# Patient Record
Sex: Male | Born: 1945 | Race: Black or African American | Hispanic: No | Marital: Single | Smoking: Current every day smoker
Health system: Southern US, Community
[De-identification: ages and names within clinical notes are randomized; demographics above are authoritative.]

## PROBLEM LIST (undated history)

## (undated) DIAGNOSIS — K5900 Constipation, unspecified: Secondary | ICD-10-CM

## (undated) DIAGNOSIS — I10 Essential (primary) hypertension: Secondary | ICD-10-CM

## (undated) DIAGNOSIS — F17200 Nicotine dependence, unspecified, uncomplicated: Secondary | ICD-10-CM

## (undated) DIAGNOSIS — F329 Major depressive disorder, single episode, unspecified: Secondary | ICD-10-CM

## (undated) DIAGNOSIS — F32A Depression, unspecified: Secondary | ICD-10-CM

## (undated) DIAGNOSIS — M109 Gout, unspecified: Secondary | ICD-10-CM

## (undated) DIAGNOSIS — C801 Malignant (primary) neoplasm, unspecified: Secondary | ICD-10-CM

## (undated) HISTORY — DX: Nicotine dependence, unspecified, uncomplicated: F17.200

## (undated) HISTORY — DX: Constipation, unspecified: K59.00

## (undated) HISTORY — DX: Essential (primary) hypertension: I10

## (undated) HISTORY — PX: FINGER SURGERY: SHX640

## (undated) HISTORY — DX: Depression, unspecified: F32.A

## (undated) HISTORY — DX: Gout, unspecified: M10.9

---
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
## 1999-07-28 ENCOUNTER — Ambulatory Visit (HOSPITAL_COMMUNITY): Admission: RE | Admit: 1999-07-28 | Discharge: 1999-07-28 | Payer: Self-pay | Admitting: Neurosurgery

## 1999-07-28 ENCOUNTER — Encounter: Payer: Self-pay | Admitting: Neurosurgery

## 1999-08-26 ENCOUNTER — Ambulatory Visit (HOSPITAL_COMMUNITY): Admission: RE | Admit: 1999-08-26 | Discharge: 1999-08-26 | Payer: Self-pay | Admitting: Neurosurgery

## 1999-08-26 ENCOUNTER — Encounter: Payer: Self-pay | Admitting: Neurosurgery

## 1999-09-09 ENCOUNTER — Encounter: Payer: Self-pay | Admitting: Neurosurgery

## 1999-09-09 ENCOUNTER — Ambulatory Visit (HOSPITAL_COMMUNITY): Admission: RE | Admit: 1999-09-09 | Discharge: 1999-09-09 | Payer: Self-pay | Admitting: Neurosurgery

## 1999-09-23 ENCOUNTER — Encounter: Payer: Self-pay | Admitting: Neurosurgery

## 1999-09-23 ENCOUNTER — Ambulatory Visit (HOSPITAL_COMMUNITY): Admission: RE | Admit: 1999-09-23 | Discharge: 1999-09-23 | Payer: Self-pay | Admitting: Neurosurgery

## 2002-03-21 ENCOUNTER — Ambulatory Visit (HOSPITAL_COMMUNITY): Admission: RE | Admit: 2002-03-21 | Discharge: 2002-03-21 | Payer: Self-pay | Admitting: Cardiology

## 2004-07-12 ENCOUNTER — Ambulatory Visit (HOSPITAL_COMMUNITY): Admission: RE | Admit: 2004-07-12 | Discharge: 2004-07-12 | Payer: Self-pay | Admitting: Gastroenterology

## 2005-10-20 ENCOUNTER — Ambulatory Visit (HOSPITAL_BASED_OUTPATIENT_CLINIC_OR_DEPARTMENT_OTHER): Admission: RE | Admit: 2005-10-20 | Discharge: 2005-10-20 | Payer: Self-pay | Admitting: Orthopedic Surgery

## 2006-12-15 ENCOUNTER — Encounter: Admission: RE | Admit: 2006-12-15 | Discharge: 2006-12-15 | Payer: Self-pay | Admitting: Cardiology

## 2008-10-21 ENCOUNTER — Emergency Department (HOSPITAL_COMMUNITY): Admission: EM | Admit: 2008-10-21 | Discharge: 2008-10-21 | Payer: Self-pay | Admitting: Emergency Medicine

## 2009-05-16 ENCOUNTER — Encounter (INDEPENDENT_AMBULATORY_CARE_PROVIDER_SITE_OTHER): Payer: Self-pay | Admitting: Cardiology

## 2009-05-16 ENCOUNTER — Ambulatory Visit (HOSPITAL_COMMUNITY): Admission: RE | Admit: 2009-05-16 | Discharge: 2009-05-16 | Payer: Self-pay | Admitting: Cardiology

## 2009-05-16 ENCOUNTER — Ambulatory Visit: Payer: Self-pay | Admitting: Surgery

## 2009-06-29 ENCOUNTER — Encounter (INDEPENDENT_AMBULATORY_CARE_PROVIDER_SITE_OTHER): Payer: Self-pay | Admitting: *Deleted

## 2009-07-02 ENCOUNTER — Ambulatory Visit: Payer: Self-pay | Admitting: Internal Medicine

## 2009-07-12 ENCOUNTER — Ambulatory Visit: Payer: Self-pay | Admitting: Internal Medicine

## 2009-07-12 HISTORY — PX: COLONOSCOPY: SHX174

## 2009-07-25 ENCOUNTER — Encounter: Payer: Self-pay | Admitting: Internal Medicine

## 2010-08-27 NOTE — Miscellaneous (Signed)
Summary: [facility] Previsit/prep  Clinical Lists Changes  Medications: Added new medication of MOVIPREP 100 GM  SOLR (PEG-KCL-NACL-NASULF-NA ASC-C) As per prep instructions. - Signed Rx of MOVIPREP 100 GM  SOLR (PEG-KCL-NACL-NASULF-NA ASC-C) As per prep instructions.;  #1 x 0;  Signed;  Entered by: Wyona Almas RN;  Authorized by: Hart Carwin MD;  Method used: Electronically to [facility] [address] [place], [place], [place]  [address], Ph: [phone], Fax: [phone] Observations: Added new observation of NKA: T (07/02/2009 9:36)    Prescriptions: MOVIPREP 100 GM  SOLR (PEG-KCL-NACL-NASULF-NA ASC-C) As per prep instructions.  #1 x 0   Entered by:   Wyona Almas RN   Authorized by:   Hart Carwin MD   Signed by:   Wyona Almas RN on 07/02/2009   Method used:   Electronically to        [facility] (retail)       [address]       [place], [place]  [address]       Ph: [phone]       Fax: [phone]   RxID:   [id number]

## 2010-08-27 NOTE — Letter (Signed)
Summary: [facility] Instructions  [facility]  [address] [place], [place] [address]   Phone: [phone]  Fax: [phone]       CLARE CASTO    [birth year]    MRN: [id number]        Procedure Day Dorna Bloom: Magdalene Molly 07/12/09     Arrival Time: 1:30pm     Procedure Time: 2:30pm     Location of Procedure:                    _ x_  [facility] (4th Floor)                        PREPARATION FOR COLONOSCOPY WITH MOVIPREP   Starting 5 days prior to your procedure 12/11 do not eat nuts, seeds, popcorn, corn, beans, peas,  salads, or any raw vegetables.  Do not take any fiber supplements (e.g. Metamucil, Citrucel, and Benefiber).  THE DAY BEFORE YOUR PROCEDURE   DATE:  12/15   DAY:  Wed 1.  Drink clear liquids the entire day-NO SOLID FOOD  2.  Do not drink anything colored red or purple.  Avoid juices with pulp.  No orange juice.  3.  Drink at least 64 oz. (8 glasses) of fluid/clear liquids during the day to prevent dehydration and help the prep work efficiently.  CLEAR LIQUIDS INCLUDE: Water Jello Ice Popsicles Tea (sugar ok, no milk/cream) Powdered fruit flavored drinks Coffee (sugar ok, no milk/cream) Gatorade Juice: apple, white grape, white cranberry  Lemonade Clear bullion, consomm, broth Carbonated beverages (any kind) Strained chicken noodle soup Hard Candy                             4.  In the morning, mix first dose of MoviPrep solution:    Empty 1 Pouch A and 1 Pouch B into the disposable container    Add lukewarm drinking water to the top line of the container. Mix to dissolve    Refrigerate (mixed solution should be used within 24 hrs)  5.  Begin drinking the prep at 5:00 p.m. The MoviPrep container is divided by 4 marks.   Every 15 minutes drink the solution down to the next mark (approximately 8 oz) until the full liter is complete.   6.  Follow completed prep with 16 oz of clear liquid of your choice (Nothing red or purple).   Continue to drink clear liquids until bedtime.  7.  Before going to bed, mix second dose of MoviPrep solution:    Empty 1 Pouch A and 1 Pouch B into the disposable container    Add lukewarm drinking water to the top line of the container. Mix to dissolve    Refrigerate  THE DAY OF YOUR PROCEDURE      DATE:  12/16 DAY: Thurs  Beginning at 9:30am. (5 hours before procedure):         1. Every 15 minutes, drink the solution down to the next mark (approx 8 oz) until the full liter is complete.  2. Follow completed prep with 16 oz. of clear liquid of your choice.    3. You may drink clear liquids until  12:30pm  (2 HOURS BEFORE PROCEDURE).   MEDICATION INSTRUCTIONS  Unless otherwise instructed, you should take regular prescription medications with a small sip of water   as early as possible the morning of your procedure.  OTHER INSTRUCTIONS  You will need a responsible adult at least 66 years of age to accompany you and drive you home.   This person must remain in the waiting room during your procedure.  Wear loose fitting clothing that is easily removed.  Leave jewelry and other valuables at home.  However, you may wish to bring a book to read or  an iPod/MP3 player to listen to music as you wait for your procedure to start.  Remove all body piercing jewelry and leave at home.  Total time from sign-in until discharge is approximately 2-3 hours.  You should go home directly after your procedure and rest.  You can resume normal activities the  day after your procedure.  The day of your procedure you should not:   Drive   Make legal decisions   Operate machinery   Drink alcohol   Return to work  You will receive specific instructions about eating, activities and medications before you leave.    The above instructions have been reviewed and explained to me by   Wyona Almas RN  July 02, 2009 10:09 AM     I fully understand and can verbalize  these instructions _____________________________ Date _________

## 2010-12-13 NOTE — Op Note (Signed)
NAMECONNOR, George Olsen                ACCOUNT NO.:  [id number]   MEDICAL RECORD NO.:  [id number]          PATIENT TYPE:  AMB   LOCATION:  ENDO                         FACILITY:  [facility]   PHYSICIAN:  Jordan Hawks. Elnoria Howard, MD    DATE OF BIRTH:  December 27, 1945   DATE OF PROCEDURE:  07/12/2004  DATE OF DISCHARGE:                                 OPERATIVE REPORT   PROCEDURE:  Colonoscopy.   INDICATIONS:  Screening.   REFERRING PHYSICIAN:  Dr. Perlie Gold.   CONSENT:  Informed consent was obtained from the patient describing the  risks of bleeding, infection, perforation, medication reaction, a 10% miss  rate for a small colon cancer or polyp, and the risk of death, all of which  are not exclusive of any other complications that may occur.   PHYSICAL EXAMINATION:  CARDIAC:  Regular rate and rhythm.  CHEST:  Lungs are clear to auscultation bilaterally.  ABDOMEN:  Soft, nontender, nondistended.   MEDICATIONS:  Versed 5 mg IV, Demerol 75 mg IV.   PROCEDURE:  After adequate sedation was achieved, a rectal examination was  performed which was negative for any palpable abnormality.  The colonoscope  was then inserted in the anus and advanced under direct visualization  without difficulty to the terminal ileum.  Photo documentation of the  terminal ileum and the cecum was obtained.  The patient was noted to have a  very good prep.  Upon slow withdrawal of the colonoscope, there was no  evidence of masses, polyps, inflammation, ulceration, erosions, or vascular  abnormalities in the cecum, ascending, transverse, descending, or sigmoid  colon.  The patient is noted to have scattered very small diverticula  throughout the entire colon.  There is a higher concentration within the  descending and sigmoid colon.  There is one area that appeared to be an  inverted diverticulum.  Forceps palpation of this area revealed that the  area was smooth, however was not able to be completely reduced.  There is no  obvious  sign of abnormal growth in this area.  Retroflexion within the  rectum revealed mild external hemorrhoids.  The colonoscope was then  straightened and withdrawn from the patient, and the procedure was  terminated.  The patient tolerated the procedure well.  No complications  were encountered.   PLAN:  1.  Repeat colonoscopy in five years with a closer inspection of the sigmoid      colon for the possible inverted diverticulosis versus a true polyp.  2.  The patient is to maintain a high-fiber diet.       PDH/MEDQ  D:  07/12/2004  T:  07/12/2004  Job:  [id number]   cc:   Dr. Perlie Gold

## 2010-12-13 NOTE — Cardiovascular Report (Signed)
NAMEHARVEER, George Olsen                            ACCOUNT NO.:  [id number]   MEDICAL RECORD NO.:  [id number]                   PATIENT TYPE:  OIB   LOCATION:  2899                                 FACILITY:  [facility]   PHYSICIAN:  Armanda Magic, M.D.                  DATE OF BIRTH:  Mar 02, 1946   DATE OF PROCEDURE:  04/21/2002  DATE OF DISCHARGE:  03/21/2002                              CARDIAC CATHETERIZATION   REFERRING PHYSICIAN:  Chales Salmon. Abigail Miyamoto, M.D.   INDICATIONS FOR PROCEDURE:  Strong family history of coronary artery disease  and abnormal exercise treadmill test with decreased perfusion in the  inferior wall.   DESCRIPTION OF PROCEDURE:  The patient was brought to the cardiac  catheterization lab in the fasting nonsedated state.  Informed consented was  obtained.  The patient was connected to continuous heart rate and pulse  oximetry monitoring and intermittent blood pressure monitoring. The right  groin was prepped and draped in the usual sterile fashion.  1% Xylocaine was  used for local anesthesia.  Using the modified Seldinger technique, a 6  French sheath was placed in the right femoral artery.  Under fluoroscopic  guidance, a 6 [place] JL4 catheter was placed in the left coronary artery.  Multiple cine films were taken in 30 degree RAO and 40 degree LAO views.  This catheter was then exchanged out over a guide wire for a 6 [place] JR4  catheter which was placed under fluoroscopic guidance into the right  coronary artery.  Multiple cine films were taken in 30 degree RAO and 40  degree LAO views. This catheter was then exchanged out over a guide wire for  a 6 French angled pigtail catheter which was placed under fluoroscopic  guidance in the left ventricular cavity.  Left ventriculography was  performed in 30 degree RAO view using a total of 30 cc of contrast at 13 cc  per second.  The catheter was then pulled back across the aortic valve with  no significant gradient.  At the  end of the procedure, all catheters and  sheaths were removed.  Manual compression was performed until adequate  hemostasis was obtained.  The patient was transferred back to the room in  stable condition.   RESULTS:  1. The left main coronary artery is widely patent and bifurcates into a left     anterior descending and left circumflex artery.  2. The left circumflex artery gives rise to three obtuse marginal branches,     all of which are widely patent.  The distal circumflex traverses the AV     groove and is patent.  3. The LAD gives rise to one very large branching diagonal branch which has     a 30% proximal narrowing and gives rise to three daughter branches all of     which are widely patent.  The LAD is widely patent  throughout its course     to the apex and gives off four additional diagonal branches all of which     are widely patent.  4. The right coronary artery is widely patent throughout its course     bifurcating distally to the posterior descending artery and     posterolateral artery.  5. Left ventriculography performed in 30 degree RAO view using a total of 30     cc of contrast at 13 cc per second showed normal left ventricular     systolic function.  EF 55% or greater.  LV pressure 135/10 mmHg, aortic     pressure 135/67 mmHg.   ASSESSMENT:  1. Noncardiac chest pain.  2.     Normal left ventricular function.  3. Nonobstructive coronary artery disease.   PLAN:  Discharge to home after IV fluid and bed rest.  Follow up with Dr.  Mayford Knife in two weeks.  Aspirin q.d.                                               Armanda Magic, M.D.    TT/MEDQ  D:  04/21/2002  T:  04/25/2002  Job:  [id number]   cc:   Chales Salmon. Abigail Miyamoto, M.D.

## 2010-12-13 NOTE — Op Note (Signed)
NAMEARLEN, DUPUIS                ACCOUNT NO.:  [id number]   MEDICAL RECORD NO.:  [id number]          PATIENT TYPE:  AMB   LOCATION:  DSC                          FACILITY:  [facility]   PHYSICIAN:  Feliberto Gottron. Turner Daniels, M.D.   DATE OF BIRTH:  March 03, 1946   DATE OF PROCEDURE:  10/20/2005  DATE OF DISCHARGE:                                 OPERATIVE REPORT   PREOPERATIVE DIAGNOSIS:  Left small finger necrosis after crushing injury at  work.   POSTOPERATIVE DIAGNOSIS:  Left small finger necrosis after crushing injury  at work.   PROCEDURE:  Left small finger distal interphalangeal joint disarticulation  and amputation.   SURGEON:  Feliberto Gottron. Turner Daniels, M.D.   FIRST ASSISTANT:  Erskine Squibb B. Su Hilt, P.A.-C.   ANESTHETIC:  General LMA.   ESTIMATED BLOOD LOSS:  Minimal.   FLUID REPLACEMENT:  500 mL crystalloid.   DRAINS PLACED:  None.   TOURNIQUET TIME:  10 minutes.   INDICATIONS FOR PROCEDURE:  65 year old man injured at work who sustained a  crushing open fracture of the left small finger distal phalanx.  He was  cleansed, loosely close with a suture, and over the next couple of weeks  most of the tip of the small finger necrosed, turned black and shriveled up.  Because of this, he is taken for distal phalanx amputation and  disarticulation at the DIP joint since the finger is already dead from this  level beyond.  The risks and benefits of surgery are understood by the  patient.  He is prepared for surgical intervention.   DESCRIPTION OF PROCEDURE:  The patient identified by armband, taken the  operating room at [facility] where the appropriate site  monitors were attached and general LMA anesthesia induced with the patient  in supine position.  The left hand was then prepped and draped in a sterile  fashion from the fingertips to the wrist and we made a small finger  tourniquet out of a small finger glove that was rolled and held with a  hemostat. We then undermined the areas of  necrotic black skin and simply  pulled the tip of the finger off removing half of the bone and pretty much  the whole distal aspect of the fingertip. We then undermined around the  remainder of the distal phalanx back to the DIP joint and removed the distal  phalanx and freshened up the skin edges in a fishmouth fashion for closure.  The tourniquet was let down, small bleeders were identified and cauterized.  The wound was  thoroughly cleansed with normal saline solution and loosely closed with 3-0  nylon suture in a horizontal fishmouth fashion.  A dressing of Xeroform, 1  inch baby Kerlix and one inch Coban was then applied.  The patient was  awakened and taken to the recovery room without difficulty.      Feliberto Gottron. Turner Daniels, M.D.  Electronically Signed     FJR/MEDQ  D:  10/20/2005  T:  10/21/2005  Job:  [id number]

## 2015-01-22 DIAGNOSIS — N529 Male erectile dysfunction, unspecified: Secondary | ICD-10-CM | POA: Insufficient documentation

## 2015-03-26 ENCOUNTER — Other Ambulatory Visit: Payer: Self-pay | Admitting: Dermatology

## 2015-09-11 DIAGNOSIS — L0291 Cutaneous abscess, unspecified: Secondary | ICD-10-CM | POA: Insufficient documentation

## 2015-10-31 ENCOUNTER — Encounter: Payer: Self-pay | Admitting: Internal Medicine

## 2016-03-05 ENCOUNTER — Ambulatory Visit (INDEPENDENT_AMBULATORY_CARE_PROVIDER_SITE_OTHER): Payer: Commercial Managed Care - PPO | Admitting: Podiatry

## 2016-03-05 ENCOUNTER — Encounter: Payer: Self-pay | Admitting: Podiatry

## 2016-03-05 VITALS — BP 174/85 | HR 74 | Ht 73.0 in | Wt 170.0 lb

## 2016-03-05 DIAGNOSIS — M79606 Pain in leg, unspecified: Secondary | ICD-10-CM

## 2016-03-05 DIAGNOSIS — B351 Tinea unguium: Secondary | ICD-10-CM | POA: Diagnosis not present

## 2016-03-05 NOTE — Progress Notes (Signed)
SUBJECTIVE: 70 y.o. year old male presents with painful feet. Has a painful callus under left foot and nails are too thick and hard to manage. He is still working in a [employer], [employer]/film/video.   REVIEW OF SYSTEMS: A comprehensive review of systems was negative.  OBJECTIVE: DERMATOLOGIC EXAMINATION: Nails: Thick dystrophic nails x 10.  Calluses: broad circular callus under the 1st MPJ left foot.   VASCULAR EXAMINATION OF LOWER LIMBS: Pedal pulses: All pedal pulses are palpable with normal pulsation.  Temperature gradient from tibial crest to dorsum of foot is within normal bilateral.  NEUROLOGIC EXAMINATION OF THE LOWER LIMBS: All epicritic and tactile sensations grossly intact.   MUSCULOSKELETAL EXAMINATION: Positive for keratoma under 1st MPJ left foot.   ASSESSMENT: Onychomycosis x 10. Plantar keratoma sub 1 left foot. Pain in lower limbs.  PLAN: Reviewed clinical findings and available treatment options. All nails and calluses debrided.

## 2016-03-05 NOTE — Patient Instructions (Signed)
Seen for hypertrophic nails and painful callus left foot. All nails and callus debrided. Return in 3 months or as needed.

## 2016-03-25 DIAGNOSIS — R634 Abnormal weight loss: Secondary | ICD-10-CM | POA: Insufficient documentation

## 2016-03-25 DIAGNOSIS — F17210 Nicotine dependence, cigarettes, uncomplicated: Secondary | ICD-10-CM | POA: Insufficient documentation

## 2016-04-24 DIAGNOSIS — K5900 Constipation, unspecified: Secondary | ICD-10-CM | POA: Insufficient documentation

## 2016-05-13 ENCOUNTER — Encounter: Payer: Self-pay | Admitting: Gastroenterology

## 2016-06-05 ENCOUNTER — Ambulatory Visit: Payer: Commercial Managed Care - PPO | Admitting: Podiatry

## 2017-04-23 DIAGNOSIS — L723 Sebaceous cyst: Secondary | ICD-10-CM | POA: Insufficient documentation

## 2017-04-23 DIAGNOSIS — L739 Follicular disorder, unspecified: Secondary | ICD-10-CM | POA: Insufficient documentation

## 2019-01-25 DIAGNOSIS — H43813 Vitreous degeneration, bilateral: Secondary | ICD-10-CM | POA: Insufficient documentation

## 2019-01-25 DIAGNOSIS — H35033 Hypertensive retinopathy, bilateral: Secondary | ICD-10-CM | POA: Insufficient documentation

## 2019-01-25 DIAGNOSIS — H2513 Age-related nuclear cataract, bilateral: Secondary | ICD-10-CM | POA: Insufficient documentation

## 2019-01-25 DIAGNOSIS — H33311 Horseshoe tear of retina without detachment, right eye: Secondary | ICD-10-CM | POA: Insufficient documentation

## 2019-06-27 ENCOUNTER — Encounter: Payer: Self-pay | Admitting: Gastroenterology

## 2019-07-04 ENCOUNTER — Encounter: Payer: Self-pay | Admitting: Gastroenterology

## 2019-07-28 DIAGNOSIS — I10 Essential (primary) hypertension: Secondary | ICD-10-CM | POA: Insufficient documentation

## 2019-07-28 DIAGNOSIS — E785 Hyperlipidemia, unspecified: Secondary | ICD-10-CM | POA: Insufficient documentation

## 2019-08-09 ENCOUNTER — Telehealth: Payer: Self-pay | Admitting: *Deleted

## 2019-08-09 NOTE — Telephone Encounter (Signed)
Pt NS 8 am PV Called pt at 815 am- No answer, LMTRC by 5 pm today to RS - if he did not call by 5 pm, PV and Colon would be canceled and he could CB to RS both at a later date. 5 pm- no cb to rs-  Mailed NS letter- canceled Pv and Colon

## 2019-08-10 NOTE — Telephone Encounter (Signed)
Pt returned your call stating that he was not aware of pre-visit. He will call back to r/s both pv and colon.

## 2019-08-22 ENCOUNTER — Encounter: Payer: Self-pay | Admitting: Gastroenterology

## 2019-08-23 ENCOUNTER — Telehealth: Payer: Self-pay

## 2019-08-23 NOTE — Telephone Encounter (Signed)

## 2019-08-24 ENCOUNTER — Ambulatory Visit (INDEPENDENT_AMBULATORY_CARE_PROVIDER_SITE_OTHER): Payer: Medicare Other | Admitting: Plastic Surgery

## 2019-08-24 ENCOUNTER — Encounter: Payer: Self-pay | Admitting: Plastic Surgery

## 2019-08-24 ENCOUNTER — Other Ambulatory Visit: Payer: Self-pay

## 2019-08-24 VITALS — BP 158/91 | HR 103 | Temp 98.2°F | Ht 72.0 in | Wt 180.2 lb

## 2019-08-24 DIAGNOSIS — L723 Sebaceous cyst: Secondary | ICD-10-CM

## 2019-08-24 NOTE — Progress Notes (Signed)
   Referring Provider Celedonio Savage, PA-C [place],  [place] [address]   CC:  Chief Complaint  Patient presents with  . Advice Only    Sebaceous cyst      George Olsen is an 74 y.o. male.  HPI: Patient presents to discuss a cyst on his left cheek that has been there for years.  It started off small and has grown a little bit but has been fairly steady for the last several months at least.  It bothers him when he is putting his glasses on and off and it will occasionally catch on things when he rubs his face.  He does not been treated previously.  He is interested in having it excised.  Allergies  Allergen Reactions  . Rosuvastatin Other (See Comments)  . Simvastatin Other (See Comments)  . Statins Other (See Comments)    myalgia  . Pravastatin Other (See Comments)    Outpatient Encounter Medications as of 08/24/2019  Medication Sig Note  . amLODipine (NORVASC) 10 MG tablet Take by mouth. 03/05/2016: Received from: [facility]: Take 10 mg by mouth daily.  . benazepril (LOTENSIN) 40 MG tablet Take by mouth. 03/05/2016: Received from: [facility]: Take 40 mg by mouth daily.  . tadalafil (CIALIS) 20 MG tablet TAKE 1 TABLET BY MOUTH PRIOR TO SEXUAL ACTIVITY AS NEEDED FOR ERECTILE DYSFUNCTION AS DIRECTED 03/05/2016: Received from: [facility]  . [DISCONTINUED] amoxicillin (AMOXIL) 500 MG capsule TAKE ONE CAPSULE BY MOUTH TWICE DAILY UNTIL COMPLETE 03/05/2016: Received from: External Pharmacy  . [DISCONTINUED] doxycycline (VIBRA-TABS) 100 MG tablet TK 1/2 T PO ONCE DAILY 03/05/2016: Received from: External Pharmacy   No facility-administered encounter medications on file as of 08/24/2019.     No past medical history on file. Noncontributory No family history on file.  Social History   Social History Narrative  . Not on file  Denies tobacco use  Review of Systems General: Denies fevers, chills, weight loss CV: Denies chest pain, shortness  of breath, palpitations  Physical Exam Vitals with BMI 08/24/2019 03/05/2016  Height 6\' 0"  6\' 1"   Weight 180 lbs 3 oz 170 lbs  BMI [id number]  Systolic 0000000 [id number]  Diastolic 91 85  Pulse [id number]    General:  No acute distress,  Alert and oriented, Non-Toxic, Normal speech and affect HEENT: Normocephalic atraumatic.  Cranial nerves grossly intact.  He has a 2 cm cyst in the left zygomatic area about 2 cm inferior and lateral to his eyelid.  The overlying skin is attenuated.  The lesion is firm and mobile.  Looks like a cyst.  Assessment/Plan Patient presents with a cyst in the left zygomatic area of his face.  It symptomatic and changing and I think it is reasonable to excise this.  We discussed the procedure and the risks that include bleeding, infection, damage to surrounding structures, need for additional procedures.  We discussed the need to avoid pulling down on his lower lid skin and I think this is unlikely to be a problem for him.  We will plan to schedule this to be done in the office under local.  I answered all of his questions.  Cindra Presume 08/24/2019, 9:29 AM

## 2019-09-05 ENCOUNTER — Other Ambulatory Visit: Payer: Self-pay

## 2019-09-05 ENCOUNTER — Ambulatory Visit (AMBULATORY_SURGERY_CENTER): Payer: Self-pay | Admitting: *Deleted

## 2019-09-05 VITALS — Temp 98.6°F | Ht 71.0 in | Wt 177.0 lb

## 2019-09-05 DIAGNOSIS — Z01818 Encounter for other preprocedural examination: Secondary | ICD-10-CM

## 2019-09-05 DIAGNOSIS — Z1211 Encounter for screening for malignant neoplasm of colon: Secondary | ICD-10-CM

## 2019-09-05 MED ORDER — NA SULFATE-K SULFATE-MG SULF 17.5-3.13-1.6 GM/177ML PO SOLN: ORAL | 0 refills | Status: DC

## 2019-09-05 NOTE — Progress Notes (Signed)
Patient is here in-person for PV. Patient denies any allergies to eggs or soy. Patient denies any problems with anesthesia/sedation. Patient denies any oxygen use at home. Patient denies taking any diet/weight loss medications or blood thinners. Patient is not being treated for MRSA or C-diff. EMMI education assisgned to the patient for the procedure, this was explained and instructions given to patient. COVID-19 screening test is on 2/17, the pt is aware. Pt is aware that care partner will wait in the car during procedure; if they feel like they will be too hot or cold to wait in the car; they may wait in the 4 th floor lobby. Patient is aware to bring only one care partner. We want them to wear a mask (we do not have any that we can provide them), practice social distancing, and we will check their temperatures when they get here.  I did remind the patient that their care partner needs to stay in the parking lot the entire time and have a cell phone available, we will call them when the pt is ready for discharge. Patient will wear mask into building.    2 day Suprep/Miralax due to constipation.

## 2019-09-13 ENCOUNTER — Encounter: Payer: Self-pay | Admitting: Gastroenterology

## 2019-09-14 ENCOUNTER — Ambulatory Visit: Payer: Medicare Other | Attending: Internal Medicine

## 2019-09-14 DIAGNOSIS — Z20822 Contact with and (suspected) exposure to covid-19: Secondary | ICD-10-CM

## 2019-09-15 LAB — NOVEL CORONAVIRUS, NAA: SARS-CoV-2, NAA: NOT DETECTED

## 2019-09-16 ENCOUNTER — Telehealth: Payer: Self-pay | Admitting: General Practice

## 2019-09-16 NOTE — Telephone Encounter (Signed)
Pt is aware covid 19 test is neg on 09-16-2019

## 2019-09-19 ENCOUNTER — Other Ambulatory Visit: Payer: Self-pay

## 2019-09-19 ENCOUNTER — Encounter: Payer: Self-pay | Admitting: Gastroenterology

## 2019-09-19 ENCOUNTER — Ambulatory Visit (AMBULATORY_SURGERY_CENTER): Payer: Medicare Other | Admitting: Gastroenterology

## 2019-09-19 VITALS — BP 128/71 | HR 66 | Temp 97.8°F | Resp 11 | Ht 71.0 in | Wt 177.0 lb

## 2019-09-19 DIAGNOSIS — D124 Benign neoplasm of descending colon: Secondary | ICD-10-CM

## 2019-09-19 DIAGNOSIS — D120 Benign neoplasm of cecum: Secondary | ICD-10-CM | POA: Diagnosis not present

## 2019-09-19 DIAGNOSIS — D127 Benign neoplasm of rectosigmoid junction: Secondary | ICD-10-CM

## 2019-09-19 DIAGNOSIS — Z1211 Encounter for screening for malignant neoplasm of colon: Secondary | ICD-10-CM

## 2019-09-19 HISTORY — PX: COLONOSCOPY: SHX174

## 2019-09-19 MED ORDER — SODIUM CHLORIDE 0.9 % IV SOLN: 500.0000 mL | Freq: Once | INTRAVENOUS | Status: DC

## 2019-09-19 NOTE — Op Note (Signed)
[facility] Patient Name: George Olsen Procedure Date: 09/19/2019 11:29 AM MRN: [id number] Endoscopist: Remo Lipps P. Havery Moros , MD Age: 74 Referring MD:  Date of Birth: May 17, 1946 Gender: Male Account #: [id number] Procedure:                Colonoscopy Indications:              Screening for colorectal malignant neoplasm Medicines:                Monitored Anesthesia Care Procedure:                Pre-Anesthesia Assessment:                           - Prior to the procedure, a History and Physical                            was performed, and patient medications and                            allergies were reviewed. The patient's tolerance of                            previous anesthesia was also reviewed. The risks                            and benefits of the procedure and the sedation                            options and risks were discussed with the patient.                            All questions were answered, and informed consent                            was obtained. Prior Anticoagulants: The patient has                            taken no previous anticoagulant or antiplatelet                            agents. ASA Grade Assessment: II - A patient with                            mild systemic disease. After reviewing the risks                            and benefits, the patient was deemed in                            satisfactory condition to undergo the procedure.                           After obtaining informed consent, the colonoscope  was passed under direct vision. Throughout the                            procedure, the patient's blood pressure, pulse, and                            oxygen saturations were monitored continuously. The                            Colonoscope was introduced through the anus and                            advanced to the the cecum, identified by                            appendiceal orifice and  ileocecal valve. The                            colonoscopy was performed without difficulty. The                            patient tolerated the procedure well. The quality                            of the bowel preparation was good. The ileocecal                            valve, appendiceal orifice, and rectum were                            photographed. Scope In: 11:32:24 AM Scope Out: 12:05:53 PM Scope Withdrawal Time: 0 hours 28 minutes 2 seconds  Total Procedure Duration: 0 hours 33 minutes 29 seconds  Findings:                 The perianal and digital rectal examinations were                            normal.                           Four sessile polyps were found in the cecum. The                            polyps were ranging from smallest (34mm) to largest                            (10-12 mm) in size. These polyps were removed with                            a cold snare. Resection and retrieval were complete.                           A diminutive polyp was found in the ileocecal  valve. The polyp was sessile. The polyp was removed                            with a cold snare. Resection and retrieval were                            complete.                           A 4 mm polyp was found in the transverse colon. The                            polyp was flat. The polyp was removed with a cold                            snare. Resection was complete, but the polyp tissue                            was not retrieved.                           A 3 mm polyp was found in the descending colon. The                            polyp was sessile. The polyp was removed with a                            cold snare. Resection and retrieval were complete.                           A 3 mm polyp was found in the recto-sigmoid colon.                            The polyp was sessile. The polyp was removed with a                            cold snare. Resection  and retrieval were complete.                           Multiple medium-mouthed diverticula were found in                            the entire colon.                           Internal hemorrhoids were found during retroflexion.                           The colon was spastic and tortous. The exam was                            otherwise without abnormality. Complications:            No immediate complications.  Estimated blood loss:                            Minimal. Estimated Blood Loss:     Estimated blood loss was minimal. Impression:               - Four 2 to 10 mm polyps in the cecum, removed with                            a cold snare. Resected and retrieved.                           - One diminutive polyp at the ileocecal valve,                            removed with a cold snare. Resected and retrieved.                           - One 4 mm polyp in the transverse colon, removed                            with a cold snare. Complete resection. Polyp tissue                            not retrieved.                           - One 3 mm polyp in the descending colon, removed                            with a cold snare. Resected and retrieved.                           - One 3 mm polyp at the recto-sigmoid colon,                            removed with a cold snare. Resected and retrieved.                           - Diverticulosis in the entire examined colon.                           - Tortous colon with spasm                           - Internal hemorrhoids.                           - The examination was otherwise normal. Recommendation:           - Patient has a contact number available for                            emergencies. The signs and symptoms of potential  delayed complications were discussed with the                            patient. Return to normal activities tomorrow.                            Written discharge instructions were  provided to the                            patient.                           - Resume previous diet.                           - Continue present medications.                           - Await pathology results. Remo Lipps P. Havery Moros, MD 09/19/2019 12:11:57 PM This report has been signed electronically.

## 2019-09-19 NOTE — Progress Notes (Signed)
To PACU, VSS. Report to [email or web address] 

## 2019-09-19 NOTE — Patient Instructions (Signed)
Handouts provided on polyps, diverticulosis, and hemorrhoids.   YOU HAD AN ENDOSCOPIC PROCEDURE TODAY AT [facility]:   Refer to the procedure report that was given to you for any specific questions about what was found during the examination.  If the procedure report does not answer your questions, please call your gastroenterologist to clarify.  If you requested that your care partner not be given the details of your procedure findings, then the procedure report has been included in a sealed envelope for you to review at your convenience later.  YOU SHOULD EXPECT: Some feelings of bloating in the abdomen. Passage of more gas than usual.  Walking can help get rid of the air that was put into your GI tract during the procedure and reduce the bloating. If you had a lower endoscopy (such as a colonoscopy or flexible sigmoidoscopy) you may notice spotting of blood in your stool or on the toilet paper. If you underwent a bowel prep for your procedure, you may not have a normal bowel movement for a few days.  Please Note:  You might notice some irritation and congestion in your nose or some drainage.  This is from the oxygen used during your procedure.  There is no need for concern and it should clear up in a day or so.  SYMPTOMS TO REPORT IMMEDIATELY:   Following lower endoscopy (colonoscopy or flexible sigmoidoscopy):  Excessive amounts of blood in the stool  Significant tenderness or worsening of abdominal pains  Swelling of the abdomen that is new, acute  Fever of 100F or higher   For urgent or emergent issues, a gastroenterologist can be reached at any hour by calling [phone].   DIET:  We do recommend a small meal at first, but then you may proceed to your regular diet.  Drink plenty of fluids but you should avoid alcoholic beverages for 24 hours.  ACTIVITY:  You should plan to take it easy for the rest of today and you should NOT DRIVE or use heavy machinery until  tomorrow (because of the sedation medicines used during the test).    FOLLOW UP: Our staff will call the number listed on your records 48-72 hours following your procedure to check on you and address any questions or concerns that you may have regarding the information given to you following your procedure. If we do not reach you, we will leave a message.  We will attempt to reach you two times.  During this call, we will ask if you have developed any symptoms of COVID 19. If you develop any symptoms (ie: fever, flu-like symptoms, shortness of breath, cough etc.) before then, please call [phone].  If you test positive for Covid 19 in the 2 weeks post procedure, please call and report this information to [place].    If any biopsies were taken you will be contacted by phone or by letter within the next 1-3 weeks.  Please call us at [phone] if you have not heard about the biopsies in 3 weeks.    SIGNATURES/CONFIDENTIALITY: You and/or your care partner have signed paperwork which will be entered into your electronic medical record.  These signatures attest to the fact that that the information above on your After Visit Summary has been reviewed and is understood.  Full responsibility of the confidentiality of this discharge information lies with you and/or your care-partner.

## 2019-09-19 NOTE — Progress Notes (Signed)
Called to room to assist during endoscopic procedure.  Patient ID and intended procedure confirmed with present staff. Received instructions for my participation in the procedure from the performing physician.  

## 2019-09-19 NOTE — Progress Notes (Signed)
Pt's states no medical or surgical changes since previsit or office visit.  Temp LC Vitals CW

## 2019-09-21 ENCOUNTER — Telehealth: Payer: Self-pay

## 2019-09-21 NOTE — Telephone Encounter (Signed)
  Follow up Call-  Call back number 09/19/2019  Post procedure Call Back phone  # [phone]  Permission to leave phone message Yes  Some recent data might be hidden     Patient questions:  Do you have a fever, pain , or abdominal swelling? No. Pain Score  0 *  Have you tolerated food without any problems? Yes.    Have you been able to return to your normal activities? Yes.    Do you have any questions about your discharge instructions: Diet   No. Medications  No. Follow up visit  No.  Do you have questions or concerns about your Care? No.  Actions: * If pain score is 4 or above: No action needed, pain <4.  1. Have you developed a fever since your procedure? no  2.   Have you had an respiratory symptoms (SOB or cough) since your procedure? no  3.   Have you tested positive for COVID 19 since your procedure no  4.   Have you had any family members/close contacts diagnosed with the COVID 19 since your procedure?  no   If yes to any of these questions please route to Joylene John, RN and Alphonsa Gin, Therapist, sports.

## 2019-09-22 ENCOUNTER — Encounter: Payer: Self-pay | Admitting: Gastroenterology

## 2020-01-16 ENCOUNTER — Other Ambulatory Visit (HOSPITAL_COMMUNITY)
Admission: RE | Admit: 2020-01-16 | Discharge: 2020-01-16 | Disposition: A | Discharge status: Home / Self Care | Payer: Medicare Other | Source: Ambulatory Visit | Attending: Plastic Surgery | Admitting: Plastic Surgery

## 2020-01-16 ENCOUNTER — Other Ambulatory Visit: Payer: Self-pay

## 2020-01-16 ENCOUNTER — Ambulatory Visit (INDEPENDENT_AMBULATORY_CARE_PROVIDER_SITE_OTHER): Payer: Medicare Other | Admitting: Plastic Surgery

## 2020-01-16 ENCOUNTER — Encounter: Payer: Self-pay | Admitting: Plastic Surgery

## 2020-01-16 VITALS — BP 154/77 | HR 80 | Temp 97.9°F | Ht 72.0 in | Wt 169.0 lb

## 2020-01-16 DIAGNOSIS — L723 Sebaceous cyst: Secondary | ICD-10-CM

## 2020-01-16 DIAGNOSIS — C44319 Basal cell carcinoma of skin of other parts of face: Secondary | ICD-10-CM | POA: Insufficient documentation

## 2020-01-16 NOTE — Progress Notes (Signed)
Operative Note   DATE OF OPERATION: 01/16/2020  LOCATION:    SURGICAL DEPARTMENT: Plastic Surgery  PREOPERATIVE DIAGNOSES:  Left cheek cyst  POSTOPERATIVE DIAGNOSES:  same  PROCEDURE:  1. Excision of left cheek cyst measuring 2.5 cm 2. Complex closure measuring 2.5 cm  SURGEON: Talmadge Coventry, MD  ANESTHESIA:  Local  COMPLICATIONS: None.   INDICATIONS FOR PROCEDURE:  The patient, George Olsen is a 74 y.o. male born on [birth year], is here for treatment of left cheek cyst. MRN: [id number]  CONSENT:  Informed consent was obtained directly from the patient. Risks, benefits and alternatives were fully discussed. Specific risks including but not limited to bleeding, infection, hematoma, seroma, scarring, pain, infection, wound healing problems, and need for further surgery were all discussed. The patient did have an ample opportunity to have questions answered to satisfaction.   DESCRIPTION OF PROCEDURE:  Local anesthesia was administered. The patient's operative site was prepped and draped in a sterile fashion. A time out was performed and all information was confirmed to be correct.  The lesion was excised with a 15 blade.  Hemostasis was obtained.  Circumferential undermining was performed and the skin was advanced and closed in layers with interrupted buried Monocryl sutures and Monocryl sutures for the skin.  The lesion excised measured 2.5 cm, and the total length of closure measured 2.5 cm.    The patient tolerated the procedure well.  There were no complications.

## 2020-01-18 ENCOUNTER — Other Ambulatory Visit: Payer: Self-pay

## 2020-01-25 LAB — SURGICAL PATHOLOGY

## 2020-02-01 ENCOUNTER — Other Ambulatory Visit: Payer: Self-pay

## 2020-02-01 ENCOUNTER — Encounter: Payer: Self-pay | Admitting: Surgical

## 2020-02-01 ENCOUNTER — Ambulatory Visit (INDEPENDENT_AMBULATORY_CARE_PROVIDER_SITE_OTHER): Payer: Medicare Other | Admitting: Surgical

## 2020-02-01 VITALS — BP 178/89 | HR 60 | Temp 97.1°F

## 2020-02-01 DIAGNOSIS — C44390 Other specified malignant neoplasm of skin of unspecified parts of face: Secondary | ICD-10-CM

## 2020-02-01 NOTE — Progress Notes (Signed)
Patient is a 74 year old male here for follow-up after excision of left cheek skin lesion on 01/16/2020 with Dr. Claudia Desanctis.. 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 Base was involved.  Patient reports he is doing well, has no complaints other than mild tenderness to the incisional site.  Incision is C/D/I, no drainage noted. No erythema noted.  Plan: Discussed with patient that a referral has been sent to Mohs surgery for additional excision of lesion due to the pathology. I discussed with the patient that I discussed the case with Dr. Claudia Desanctis as well.   Recommend covering the area with sunscreen or wearing hat when exposed to sun to prevent discoloration.  Recommend calling with questions or concerns.

## 2020-03-06 ENCOUNTER — Other Ambulatory Visit: Payer: Self-pay | Admitting: Urology

## 2020-03-06 DIAGNOSIS — N2889 Other specified disorders of kidney and ureter: Secondary | ICD-10-CM

## 2020-03-13 ENCOUNTER — Ambulatory Visit
Admission: RE | Admit: 2020-03-13 | Discharge: 2020-03-13 | Disposition: A | Discharge status: Home / Self Care | Payer: Medicare Other | Source: Ambulatory Visit | Attending: Urology | Admitting: Urology

## 2020-03-13 ENCOUNTER — Encounter: Payer: Self-pay | Admitting: *Deleted

## 2020-03-13 ENCOUNTER — Other Ambulatory Visit: Payer: Self-pay

## 2020-03-13 ENCOUNTER — Other Ambulatory Visit (HOSPITAL_COMMUNITY): Payer: Self-pay | Admitting: Interventional Radiology

## 2020-03-13 DIAGNOSIS — N2889 Other specified disorders of kidney and ureter: Secondary | ICD-10-CM

## 2020-03-13 HISTORY — PX: IR RADIOLOGIST EVAL & MGMT: IMG5224

## 2020-03-13 NOTE — Consult Note (Addendum)
Chief Complaint: Patient was consulted remotely today (TeleHealth) for left renal mass  at the request of [facility].    Referring Physician(s): Dahlstedt,Stephen  History of Present Illness: George Olsen is a 74 y.o. male who 10/07/2019 obtained CT at the [facility] for indeterminate reason, and left renal mass incidentally noted.  No previous imaging available.  No hematuria or flank pain.  No history of renal insufficiency or dialysis.  No anticoagulant use.  Past Medical History:  Diagnosis Date  . Constipation   . Depression   . Gout   . Hypertension   . Smoker     Past Surgical History:  Procedure Laterality Date  . COLONOSCOPY  07/12/2009  . COLONOSCOPY  09/19/2019  . FINGER SURGERY      Allergies: Rosuvastatin, Simvastatin, Statins, and Pravastatin  Medications: Prior to Admission medications   Medication Sig Start Date End Date Taking? Authorizing Provider  allopurinol (ZYLOPRIM) 100 MG tablet Take 100 mg by mouth daily. 07/10/19   [provider]  benazepril (LOTENSIN) 40 MG tablet Take by mouth.    [provider]  Multiple Vitamins-Minerals (CENTRUM SILVER PO) Take by mouth.    [provider]  sertraline (ZOLOFT) 50 MG tablet Take by mouth.    [provider]  tadalafil (CIALIS) 20 MG tablet TAKE 1 TABLET BY MOUTH PRIOR TO SEXUAL ACTIVITY AS NEEDED FOR ERECTILE DYSFUNCTION AS DIRECTED 01/22/15   [provider]     Family History  Problem Relation Age of Onset  . Esophageal cancer Neg Hx   . Colon polyps Neg Hx   . Colon cancer Neg Hx   . Rectal cancer Neg Hx   . Stomach cancer Neg Hx     Social History   Socioeconomic History  . Marital status: Divorced    Spouse name: Not on file  . Number of children: Not on file  . Years of education: Not on file  . Highest education level: Not on file  Occupational History  . Not on file  Tobacco Use  . Smoking status: Current Every Day Smoker     Packs/day: 0.50    Types: Cigarettes  . Smokeless tobacco: Never Used  Vaping Use  . Vaping Use: Never used  Substance and Sexual Activity  . Alcohol use: Not Currently  . Drug use: Not Currently  . Sexual activity: Not on file  Other Topics Concern  . Not on file  Social History Narrative  . Not on file   Social Determinants of Health   Financial Resource Strain:   . Difficulty of Paying Living Expenses:   Food Insecurity:   . Worried About Charity fundraiser in the Last Year:   . Arboriculturist in the Last Year:   Transportation Needs:   . Film/video editor (Medical):   Marland Kitchen Lack of Transportation (Non-Medical):   Physical Activity:   . Days of Exercise per Week:   . Minutes of Exercise per Session:   Stress:   . Feeling of Stress :   Social Connections:   . Frequency of Communication with Friends and Family:   . Frequency of Social Gatherings with Friends and Family:   . Attends Religious Services:   . Active Member of Clubs or Organizations:   . Attends Archivist Meetings:   Marland Kitchen Marital Status:     ECOG Status: 1 - Symptomatic but completely ambulatory  Review of Systems  Review of Systems: A 12 point ROS discussed  and pertinent positives are indicated in the HPI above.  All other systems are negative.  Physical Exam No direct physical exam was performed (except for noted visual exam findings with Video Visits).     Vital Signs: There were no vitals taken for this visit.  Imaging: CT abdomen without and with contrast 10/07/2019 from the [place], on my review demonstrates partially exophytic lesion laterally from the upper pole left kidney measuring up to 2.9 cm transverse diameter.  No invasion to the hilum.  No intravenous extension or caval extension evident.  No regional adenopathy.  Regional bones unremarkable.  Labs:  CBC: No results for input(s): WBC, HGB, HCT, PLT in the last 8760 hours.  COAGS: No results for input(s): INR, APTT in the  last 8760 hours.  BMP: No results for input(s): NA, K, CL, CO2, GLUCOSE, BUN, CALCIUM, CREATININE, GFRNONAA, GFRAA in the last 8760 hours.  Invalid input(s): CMP  LIVER FUNCTION TESTS: No results for input(s): BILITOT, AST, ALT, ALKPHOS, PROT, ALBUMIN in the last 8760 hours.  TUMOR MARKERS: No results for input(s): AFPTM, CEA, CA199, CHROMGRNA in the last 8760 hours.  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  Its position partially exophytic from the upper pole renders it quite approachable for percutaneous ablation.  Ablation would also likely require prophylactic hydrodissection due to proximity of spleen.  Nephrectomy or partial nephrectomy would also be feasible alternative curative options.  I reviewed the findings with the patient.  I reviewed the currently there is no evidence of metastatic disease, and given the size the expectation would be that treatment of the primary lesion will be curative.  We discussed in detail the percutaneous cryoablation technique, anticipated benefits, time course to resolution, need for continued CT surveillance x5 years, possible risks and complications, and alternatives.  He understands he would be under anesthesia for the procedure and kept overnight for observation.  He seemed to understand and had his questions answered.  He is motivated to proceed.  Accordingly, we can set this up him up for CT-guided cryoablation of left renal mass under anesthesia with extended observation at [facility].  I would obtain a core biopsy sample at the time to confirm the pathology and exclude the unlikely possibility of oncocytoma or other benign lesion.  Thank you for this interesting consult.  I greatly enjoyed meeting Micael Barb and look forward to participating in their care.  A copy of this report was sent to the requesting provider on this  date.  Electronically Signed: Rickard Rhymes 03/13/2020, 11:17 AM   I spent a total of  30 Minutes   in remote  clinical consultation, greater than 50% of which was counseling/coordinating care for left upper pole renal neoplasm.    Visit type: Audio only (telephone). Audio (no video) only due to patient's lack of internet/smartphone capability. Alternative for in-person consultation at [facility], [address] [place], [place], [place]. This visit type was conducted due to national recommendations for restrictions regarding the COVID-19 Pandemic (e.g. social distancing).  This format is felt to be most appropriate for this patient at this time.  All issues noted in this document were discussed and addressed.

## 2020-03-14 ENCOUNTER — Other Ambulatory Visit (HOSPITAL_COMMUNITY): Payer: Self-pay | Admitting: Interventional Radiology

## 2020-03-14 DIAGNOSIS — N2889 Other specified disorders of kidney and ureter: Secondary | ICD-10-CM

## 2020-03-26 ENCOUNTER — Other Ambulatory Visit: Payer: Self-pay | Admitting: Student

## 2020-03-26 NOTE — Progress Notes (Signed)
DUE TO COVID-19 ONLY ONE VISITOR IS ALLOWED TO COME WITH YOU AND STAY IN THE WAITING ROOM ONLY DURING PRE OP AND PROCEDURE DAY OF SURGERY. THE 1 VISITOR  MAY VISIT WITH YOU AFTER SURGERY IN YOUR PRIVATE ROOM DURING VISITING HOURS ONLY!  YOU NEED TO HAVE A COVID 19 TEST ON_______ @_______ , THIS TEST MUST BE DONE BEFORE SURGERY,  COVID TESTING SITE [address] [place] [address], IT IS ON THE RIGHT GOING OUT [address] APPROXIMATELY  2 MINUTES PAST [employer] ON THE RIGHT. ONCE YOUR COVID TEST IS COMPLETED,  PLEASE BEGIN THE QUARANTINE INSTRUCTIONS AS OUTLINED IN YOUR HANDOUT.                Donne Baley  03/26/2020   Your procedure is scheduled on: 04/04/2020   0700am   Report to [facility] Main  Entrance   Report to admitting at 0700am  AM     Call this number if you have problems the morning of surgery [phone]    Remember: Do not eat food , candy gum or mints :After Midnight. You may have clear liquids from midnight until 0530am    CLEAR LIQUID DIET   Foods Allowed                                                                       Coffee and tea, regular and decaf                              Plain Jell-O any favor except red or purple                                            Fruit ices (not with fruit pulp)                                      Iced Popsicles                                     Carbonated beverages, regular and diet                                    Cranberry, grape and apple juices Sports drinks like Gatorade Lightly seasoned clear broth or consume(fat free) Sugar, honey syrup   _____________________________________________________________________    BRUSH YOUR TEETH MORNING OF SURGERY AND RINSE YOUR MOUTH OUT, NO CHEWING GUM CANDY OR MINTS.     Take these medicines the morning of surgery with A SIP OF WATER:  Allopurinol, amlodipine, Zolft                                 You may not have any metal on your  body including hair pins and  piercings  Do not wear jewelry, make-up, lotions, powders or perfumes, deodorant             Do not wear nail polish on your fingernails.  Do not shave  48 hours prior to surgery.              Men may shave face and neck.   Do not bring valuables to the hospital. [place].  Contacts, dentures or bridgework may not be worn into surgery.  Leave suitcase in the car. After surgery it may be brought to your room.     Patients discharged the day of surgery will not be allowed to drive home. IF YOU ARE HAVING SURGERY AND GOING HOME THE SAME DAY, YOU MUST HAVE AN ADULT TO DRIVE YOU HOME AND BE WITH YOU FOR 24 HOURS. YOU MAY GO HOME BY TAXI OR UBER OR ORTHERWISE, BUT AN ADULT MUST ACCOMPANY YOU HOME AND STAY WITH YOU FOR 24 HOURS.  Name and phone number of your driver:  Special Instructions: N/A              Please read over the following fact sheets you were given: _____________________________________________________________________  [facility] - Preparing for Surgery Before surgery, you can play an important role.  Because skin is not sterile, your skin needs to be as free of germs as possible.  You can reduce the number of germs on your skin by washing with CHG (chlorahexidine gluconate) soap before surgery.  CHG is an antiseptic cleaner which kills germs and bonds with the skin to continue killing germs even after washing. Please DO NOT use if you have an allergy to CHG or antibacterial soaps.  If your skin becomes reddened/irritated stop using the CHG and inform your nurse when you arrive at Short Stay. Do not shave (including legs and underarms) for at least 48 hours prior to the first CHG shower.  You may shave your face/neck. Please follow these instructions carefully:  1.  Shower with CHG Soap the night before surgery and the  morning of Surgery.  2.  If you choose to wash your hair, wash your hair  first as usual with your  normal  shampoo.  3.  After you shampoo, rinse your hair and body thoroughly to remove the  shampoo.                           4.  Use CHG as you would any other liquid soap.  You can apply chg directly  to the skin and wash                       Gently with a scrungie or clean washcloth.  5.  Apply the CHG Soap to your body ONLY FROM THE NECK DOWN.   Do not use on face/ open                           Wound or open sores. Avoid contact with eyes, ears mouth and genitals (private parts).                       Wash face,  Genitals (private parts) with your normal soap.             6.  Wash thoroughly, paying special attention to the area where your surgery  will be performed.  7.  Thoroughly rinse your body with warm water from the neck down.  8.  DO NOT shower/wash with your normal soap after using and rinsing off  the CHG Soap.                9.  Pat yourself dry with a clean towel.            10.  Wear clean pajamas.            11.  Place clean sheets on your bed the night of your first shower and do not  sleep with pets. Day of Surgery : Do not apply any lotions/deodorants the morning of surgery.  Please wear clean clothes to the hospital/surgery center.  FAILURE TO FOLLOW THESE INSTRUCTIONS MAY RESULT IN THE CANCELLATION OF YOUR SURGERY PATIENT SIGNATURE_________________________________  NURSE SIGNATURE__________________________________  ________________________________________________________________________

## 2020-03-27 ENCOUNTER — Encounter (HOSPITAL_COMMUNITY): Payer: Self-pay

## 2020-03-27 ENCOUNTER — Encounter (HOSPITAL_COMMUNITY)
Admission: RE | Admit: 2020-03-27 | Discharge: 2020-03-27 | Disposition: A | Discharge status: Home / Self Care | Payer: Medicare Other | Source: Ambulatory Visit | Attending: Interventional Radiology | Admitting: Interventional Radiology

## 2020-03-27 ENCOUNTER — Other Ambulatory Visit: Payer: Self-pay

## 2020-03-27 DIAGNOSIS — Z01818 Encounter for other preprocedural examination: Secondary | ICD-10-CM | POA: Insufficient documentation

## 2020-03-27 LAB — PROTIME-INR
INR: 0.9 (ref 0.8–1.2)
Prothrombin Time: 12.2 seconds (ref 11.4–15.2)

## 2020-03-27 LAB — CBC WITH DIFFERENTIAL/PLATELET
Abs Immature Granulocytes: 0.03 10*3/uL (ref 0.00–0.07)
Basophils Absolute: 0.1 10*3/uL (ref 0.0–0.1)
Basophils Relative: 1 %
Eosinophils Absolute: 0.6 10*3/uL — ABNORMAL HIGH (ref 0.0–0.5)
Eosinophils Relative: 6 %
HCT: 39.6 % (ref 39.0–52.0)
Hemoglobin: 12.4 g/dL — ABNORMAL LOW (ref 13.0–17.0)
Immature Granulocytes: 0 %
Lymphocytes Relative: 24 %
Lymphs Abs: 2.3 10*3/uL (ref 0.7–4.0)
MCH: 24.9 pg — ABNORMAL LOW (ref 26.0–34.0)
MCHC: 31.3 g/dL (ref 30.0–36.0)
MCV: 79.7 fL — ABNORMAL LOW (ref 80.0–100.0)
Monocytes Absolute: 1.1 10*3/uL — ABNORMAL HIGH (ref 0.1–1.0)
Monocytes Relative: 11 %
Neutro Abs: 5.7 10*3/uL (ref 1.7–7.7)
Neutrophils Relative %: 58 %
Platelets: 511 10*3/uL — ABNORMAL HIGH (ref 150–400)
RBC: 4.97 MIL/uL (ref 4.22–5.81)
RDW: 17.6 % — ABNORMAL HIGH (ref 11.5–15.5)
WBC: 9.8 10*3/uL (ref 4.0–10.5)
nRBC: 0 % (ref 0.0–0.2)

## 2020-03-27 LAB — BASIC METABOLIC PANEL
Anion gap: 8 (ref 5–15)
BUN: 14 mg/dL (ref 8–23)
CO2: 26 mmol/L (ref 22–32)
Calcium: 9 mg/dL (ref 8.9–10.3)
Chloride: 105 mmol/L (ref 98–111)
Creatinine, Ser: 1.19 mg/dL (ref 0.61–1.24)
GFR calc Af Amer: >60 mL/min (ref >60)
GFR calc non Af Amer: 59 mL/min — ABNORMAL LOW (ref >60)
Glucose, Bld: 98 mg/dL (ref 70–99)
Potassium: 3.7 mmol/L (ref 3.5–5.1)
Sodium: 139 mmol/L (ref 135–145)

## 2020-03-27 NOTE — Progress Notes (Signed)
Anesthesia Review:  PCP: none  Cardiologist :none  Chest x-ray : EKG : 03/27/20 Echo : Stress test: Cardiac Cath :  Activity level: can do a flight of stairs without difficuilty , pt still works full time  Sleep Study/ CPAP : no  Fasting Blood Sugar :      / Checks Blood Sugar -- times a day:  No  Blood Thinner/ Instructions /Last Dose: ASA / Instructions/ Last Dose :  CBC results done 03/27/20 routed via epic to Dr Vernard Gambles.

## 2020-03-27 NOTE — Progress Notes (Signed)
CBC done 03/27/20 routed via epic to DR [facility].

## 2020-03-31 ENCOUNTER — Other Ambulatory Visit (HOSPITAL_COMMUNITY): Payer: Medicare Other

## 2020-04-03 ENCOUNTER — Other Ambulatory Visit: Payer: Self-pay | Admitting: Radiology

## 2020-04-03 ENCOUNTER — Other Ambulatory Visit: Payer: Self-pay | Admitting: Physician Assistant

## 2020-04-03 NOTE — Anesthesia Preprocedure Evaluation (Addendum)
Anesthesia Evaluation  Patient identified by MRN, date of birth, ID band Patient awake    Reviewed: Allergy & Precautions, NPO status , Patient's Chart, lab work & pertinent test results  History of Anesthesia Complications Negative for: history of anesthetic complications  Airway Mallampati: III  TM Distance: >3 FB Neck ROM: Full    Dental  (+) Partial Upper   Pulmonary Current SmokerPatient did not abstain from smoking.,    Pulmonary exam normal breath sounds clear to auscultation       Cardiovascular hypertension, Pt. on medications Normal cardiovascular exam Rhythm:Regular Rate:Normal     Neuro/Psych PSYCHIATRIC DISORDERS Depression negative neurological ROS     GI/Hepatic negative GI ROS, Neg liver ROS,   Endo/Other  negative endocrine ROS  Renal/GU Left renal mass     Musculoskeletal negative musculoskeletal ROS (+)   Abdominal   Peds  Hematology  (+) Blood dyscrasia, anemia ,   Anesthesia Other Findings   Reproductive/Obstetrics                            Anesthesia Physical Anesthesia Plan  ASA: II  Anesthesia Plan: General   Post-op Pain Management:    Induction: Intravenous  PONV Risk Score and Plan: 2 and Ondansetron and Dexamethasone  Airway Management Planned: Oral ETT  Additional Equipment:   Intra-op Plan:   Post-operative Plan: Extubation in OR  Informed Consent: I have reviewed the patients History and Physical, chart, labs and discussed the procedure including the risks, benefits and alternatives for the proposed anesthesia with the patient or authorized representative who has indicated his/her understanding and acceptance.       Plan Discussed with: CRNA  Anesthesia Plan Comments:        Anesthesia Quick Evaluation

## 2020-04-04 ENCOUNTER — Encounter (HOSPITAL_COMMUNITY)
Admission: RE | Disposition: A | Discharge status: Home / Self Care | Payer: Self-pay | Source: Home / Self Care | Attending: Interventional Radiology

## 2020-04-04 ENCOUNTER — Other Ambulatory Visit: Payer: Self-pay

## 2020-04-04 ENCOUNTER — Encounter (HOSPITAL_COMMUNITY): Payer: Self-pay | Admitting: Interventional Radiology

## 2020-04-04 ENCOUNTER — Ambulatory Visit (HOSPITAL_COMMUNITY): Payer: Medicare Other | Admitting: Certified Registered"

## 2020-04-04 ENCOUNTER — Ambulatory Visit (HOSPITAL_COMMUNITY): Payer: Medicare Other

## 2020-04-04 ENCOUNTER — Observation Stay (HOSPITAL_COMMUNITY)
Admission: RE | Admit: 2020-04-04 | Discharge: 2020-04-05 | Disposition: A | Discharge status: Home / Self Care | Payer: Medicare Other | Attending: Interventional Radiology | Admitting: Interventional Radiology

## 2020-04-04 ENCOUNTER — Ambulatory Visit (HOSPITAL_COMMUNITY)
Admission: RE | Admit: 2020-04-04 | Discharge: 2020-04-04 | Disposition: A | Discharge status: Home / Self Care | Payer: Medicare Other | Source: Ambulatory Visit | Attending: Interventional Radiology | Admitting: Interventional Radiology

## 2020-04-04 DIAGNOSIS — Z79899 Other long term (current) drug therapy: Secondary | ICD-10-CM | POA: Diagnosis not present

## 2020-04-04 DIAGNOSIS — I10 Essential (primary) hypertension: Secondary | ICD-10-CM | POA: Insufficient documentation

## 2020-04-04 DIAGNOSIS — C649 Malignant neoplasm of unspecified kidney, except renal pelvis: Principal | ICD-10-CM | POA: Insufficient documentation

## 2020-04-04 DIAGNOSIS — F17290 Nicotine dependence, other tobacco product, uncomplicated: Secondary | ICD-10-CM | POA: Diagnosis not present

## 2020-04-04 DIAGNOSIS — N2889 Other specified disorders of kidney and ureter: Secondary | ICD-10-CM | POA: Diagnosis present

## 2020-04-04 DIAGNOSIS — Z20822 Contact with and (suspected) exposure to covid-19: Secondary | ICD-10-CM | POA: Diagnosis not present

## 2020-04-04 DIAGNOSIS — Z01818 Encounter for other preprocedural examination: Secondary | ICD-10-CM

## 2020-04-04 HISTORY — PX: RADIOLOGY WITH ANESTHESIA: SHX6223

## 2020-04-04 LAB — TYPE AND SCREEN
ABO/RH(D): O POS
Antibody Screen: NEGATIVE

## 2020-04-04 LAB — ABO/RH: ABO/RH(D): O POS

## 2020-04-04 LAB — SARS CORONAVIRUS 2 BY RT PCR (HOSPITAL ORDER, PERFORMED IN ~~LOC~~ HOSPITAL LAB): SARS Coronavirus 2: NEGATIVE

## 2020-04-04 SURGERY — CT WITH ANESTHESIA
Anesthesia: General | Laterality: Left

## 2020-04-04 MED ORDER — ONDANSETRON HCL 4 MG/2ML IJ SOLN: 4.0000 mg | Freq: Once | INTRAMUSCULAR | Status: DC | PRN

## 2020-04-04 MED ORDER — PROPOFOL 10 MG/ML IV BOLUS
INTRAVENOUS | Status: DC | PRN
  Administered 2020-04-04: 140 mg via INTRAVENOUS

## 2020-04-04 MED ORDER — ORAL CARE MOUTH RINSE: 15.0000 mL | Freq: Once | OROMUCOSAL | Status: AC

## 2020-04-04 MED ORDER — FENTANYL CITRATE (PF) 100 MCG/2ML IJ SOLN
25.0000 ug | INTRAMUSCULAR | Status: DC | PRN
  Administered 2020-04-04 (×2): 25 ug via INTRAVENOUS

## 2020-04-04 MED ORDER — SODIUM CHLORIDE 0.9 % IV SOLN
INTRAVENOUS | Status: AC
  Filled 2020-04-04: qty 250

## 2020-04-04 MED ORDER — BENAZEPRIL HCL 10 MG PO TABS
40.0000 mg | ORAL_TABLET | Freq: Every day | ORAL | Status: DC
  Administered 2020-04-04 – 2020-04-05 (×2): 40 mg via ORAL
  Filled 2020-04-04 (×2): qty 4

## 2020-04-04 MED ORDER — LACTATED RINGERS IV SOLN: INTRAVENOUS | Status: DC

## 2020-04-04 MED ORDER — ROCURONIUM BROMIDE 10 MG/ML (PF) SYRINGE
PREFILLED_SYRINGE | INTRAVENOUS | Status: DC | PRN
  Administered 2020-04-04 (×2): 20 mg via INTRAVENOUS
  Administered 2020-04-04: 50 mg via INTRAVENOUS

## 2020-04-04 MED ORDER — PHENYLEPHRINE HCL-NACL 10-0.9 MG/250ML-% IV SOLN
INTRAVENOUS | Status: DC | PRN
  Administered 2020-04-04: 20 ug/min via INTRAVENOUS

## 2020-04-04 MED ORDER — FENTANYL CITRATE (PF) 250 MCG/5ML IJ SOLN
INTRAMUSCULAR | Status: AC
  Filled 2020-04-04: qty 5

## 2020-04-04 MED ORDER — ACETAMINOPHEN 500 MG PO TABS
1000.0000 mg | ORAL_TABLET | Freq: Once | ORAL | Status: AC
  Administered 2020-04-04: 1000 mg via ORAL
  Filled 2020-04-04: qty 2

## 2020-04-04 MED ORDER — LABETALOL HCL 5 MG/ML IV SOLN
INTRAVENOUS | Status: DC | PRN
  Administered 2020-04-04: 5 mg via INTRAVENOUS

## 2020-04-04 MED ORDER — CHLORHEXIDINE GLUCONATE 0.12 % MT SOLN
15.0000 mL | Freq: Once | OROMUCOSAL | Status: AC
  Administered 2020-04-04: 15 mL via OROMUCOSAL

## 2020-04-04 MED ORDER — FENTANYL CITRATE (PF) 250 MCG/5ML IJ SOLN
INTRAMUSCULAR | Status: DC | PRN
Start: 2020-04-04 — End: 2020-04-04
  Administered 2020-04-04 (×2): 25 ug via INTRAVENOUS
  Administered 2020-04-04 (×2): 50 ug via INTRAVENOUS
  Administered 2020-04-04 (×2): 25 ug via INTRAVENOUS
  Administered 2020-04-04: 50 ug via INTRAVENOUS

## 2020-04-04 MED ORDER — DEXAMETHASONE SODIUM PHOSPHATE 10 MG/ML IJ SOLN
INTRAMUSCULAR | Status: DC | PRN
  Administered 2020-04-04: 4 mg via INTRAVENOUS

## 2020-04-04 MED ORDER — LIDOCAINE 2% (20 MG/ML) 5 ML SYRINGE
INTRAMUSCULAR | Status: DC | PRN
  Administered 2020-04-04: 40 mg via INTRAVENOUS

## 2020-04-04 MED ORDER — CEFAZOLIN SODIUM-DEXTROSE 2-4 GM/100ML-% IV SOLN
2.0000 g | INTRAVENOUS | Status: AC
  Administered 2020-04-04: 2 g via INTRAVENOUS
  Filled 2020-04-04: qty 100

## 2020-04-04 MED ORDER — ALLOPURINOL 100 MG PO TABS
100.0000 mg | ORAL_TABLET | Freq: Every day | ORAL | Status: DC
  Administered 2020-04-04 – 2020-04-05 (×2): 100 mg via ORAL
  Filled 2020-04-04 (×2): qty 1

## 2020-04-04 MED ORDER — PHENYLEPHRINE 40 MCG/ML (10ML) SYRINGE FOR IV PUSH (FOR BLOOD PRESSURE SUPPORT)
PREFILLED_SYRINGE | INTRAVENOUS | Status: DC | PRN
  Administered 2020-04-04: 120 ug via INTRAVENOUS
  Administered 2020-04-04 (×3): 40 ug via INTRAVENOUS

## 2020-04-04 MED ORDER — IOHEXOL 300 MG/ML  SOLN
75.0000 mL | Freq: Once | INTRAMUSCULAR | Status: AC | PRN
  Administered 2020-04-04: 75 mL via INTRAVENOUS

## 2020-04-04 MED ORDER — HYDRALAZINE HCL 20 MG/ML IJ SOLN: 10.0000 mg | Freq: Once | INTRAMUSCULAR | Status: AC

## 2020-04-04 MED ORDER — AMLODIPINE BESYLATE 10 MG PO TABS
10.0000 mg | ORAL_TABLET | Freq: Every day | ORAL | Status: DC
  Administered 2020-04-04 – 2020-04-05 (×2): 10 mg via ORAL
  Filled 2020-04-04 (×2): qty 1

## 2020-04-04 MED ORDER — HYDRALAZINE HCL 20 MG/ML IJ SOLN
INTRAMUSCULAR | Status: AC
  Administered 2020-04-04: 10 mg via INTRAVENOUS
  Filled 2020-04-04: qty 1

## 2020-04-04 MED ORDER — SENNOSIDES-DOCUSATE SODIUM 8.6-50 MG PO TABS: 1.0000 | ORAL_TABLET | Freq: Every day | ORAL | Status: DC | PRN

## 2020-04-04 MED ORDER — ONDANSETRON HCL 4 MG/2ML IJ SOLN
INTRAMUSCULAR | Status: DC | PRN
  Administered 2020-04-04: 4 mg via INTRAVENOUS

## 2020-04-04 MED ORDER — ONDANSETRON HCL 4 MG/2ML IJ SOLN: 4.0000 mg | Freq: Four times a day (QID) | INTRAMUSCULAR | Status: DC | PRN

## 2020-04-04 MED ORDER — HYDROCODONE-ACETAMINOPHEN 5-325 MG PO TABS: 1.0000 | ORAL_TABLET | ORAL | Status: DC | PRN

## 2020-04-04 MED ORDER — DOCUSATE SODIUM 100 MG PO CAPS
100.0000 mg | ORAL_CAPSULE | Freq: Two times a day (BID) | ORAL | Status: DC
  Administered 2020-04-04 – 2020-04-05 (×2): 100 mg via ORAL
  Filled 2020-04-04 (×2): qty 1

## 2020-04-04 MED ORDER — SUGAMMADEX SODIUM 200 MG/2ML IV SOLN
INTRAVENOUS | Status: DC | PRN
  Administered 2020-04-04: 200 mg via INTRAVENOUS

## 2020-04-04 MED ORDER — FENTANYL CITRATE (PF) 100 MCG/2ML IJ SOLN
INTRAMUSCULAR | Status: AC
  Filled 2020-04-04: qty 2

## 2020-04-04 NOTE — Progress Notes (Signed)
Patient ID: George Olsen, male   DOB: August 03, 1945, 74 y.o.   MRN: [id number] Pt resting quietly; only c/o is some mild LUQ/epigastric discomfort; denies N/V , resp difficulties or sig left flank pain BP 146/82; HR 79; temp 99.3; O2 sats 100 % Puncture site left flank with clean overlying gauze dressing, abd soft,ND, mild epig/LUQ tenderness; yellow urine in foley cath  A/P: Status post CT-guided cryoablation and core biopsy of suspicious left renal mass earlier today; for overnight observation; DC Foley later this evening, gentle hydration, Norco for pain; check final pathology, IR follow-up with Dr. Vernard Gambles in 4 weeks.

## 2020-04-04 NOTE — Anesthesia Procedure Notes (Signed)
Procedure Name: Intubation Date/Time: 04/04/2020 8:58 AM Performed by: Eben Burow, CRNA Pre-anesthesia Checklist: Patient identified, Emergency Drugs available, Suction available, Patient being monitored and Timeout performed Patient Re-evaluated:Patient Re-evaluated prior to induction Oxygen Delivery Method: Circle system utilized Preoxygenation: Pre-oxygenation with 100% oxygen Induction Type: IV induction Ventilation: Mask ventilation without difficulty Laryngoscope Size: Mac and 4 Grade View: Grade I Tube type: Oral Tube size: 7.5 mm Number of attempts: 1 Airway Equipment and Method: Stylet Placement Confirmation: ETT inserted through vocal cords under direct vision,  positive ETCO2 and breath sounds checked- equal and bilateral Secured at: 23 cm Tube secured with: Tape Dental Injury: Teeth and Oropharynx as per pre-operative assessment

## 2020-04-04 NOTE — Procedures (Signed)
  Procedure: CT guided cryoablation L renal mass, core biopsy   EBL:   minimal Complications:  none immediate  See full dictation in [employer].  Dillard Cannon MD Main # [phone] Pager  [phone]

## 2020-04-04 NOTE — Anesthesia Postprocedure Evaluation (Signed)
Anesthesia Post Note  Patient: George Olsen  Procedure(s) Performed: CT WITH ANESTHESIA CT CRYOABLATION (Left )     Patient location during evaluation: PACU Anesthesia Type: General Level of consciousness: awake and alert Pain management: pain level controlled Vital Signs Assessment: post-procedure vital signs reviewed and stable Respiratory status: spontaneous breathing, nonlabored ventilation, respiratory function stable and patient connected to nasal cannula oxygen Cardiovascular status: blood pressure returned to baseline and stable Postop Assessment: no apparent nausea or vomiting Anesthetic complications: no   No complications documented.  Last Vitals:  Vitals:   04/04/20 1546 04/04/20 1621  BP: (!) 162/76 (!) 146/82  Pulse: 70 79  Resp: 15 15  Temp: 37.2 C 37.4 C  SpO2: 100% 100%    Last Pain:  Vitals:   04/04/20 1621  TempSrc: Oral  PainSc:                  Tiajuana Amass

## 2020-04-04 NOTE — H&P (Signed)
Referring Physician(s): Dahlstedt,S  Supervising Physician: Arne Cleveland  Patient Status:  [facility] OP TBA  Chief Complaint: Left renal mass   Subjective: Patient familiar to IR service from tele consultation with Dr. 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  He was deemed an appropriate candidate for CT-guided biopsy and cryoablation of the left renal mass and presents today for the procedure.  He currently denies fever, headache, chest pain, dyspnea, cough, abdominal/back pain, nausea, vomiting or bleeding.  Additional medical history as below.  Past Medical History:  Diagnosis Date  . Constipation   . Depression   . Gout   . Hypertension   . Smoker    Past Surgical History:  Procedure Laterality Date  . COLONOSCOPY  07/12/2009  . COLONOSCOPY  09/19/2019  . FINGER SURGERY    . IR RADIOLOGIST EVAL & MGMT  03/13/2020      Allergies: Statins  Medications: Prior to Admission medications   Medication Sig Start Date End Date Taking? Authorizing Provider  allopurinol (ZYLOPRIM) 100 MG tablet Take 100 mg by mouth daily. 07/10/19  Yes [provider]  amLODipine (NORVASC) 10 MG tablet Take 10 mg by mouth daily.   Yes [provider]  benazepril (LOTENSIN) 40 MG tablet Take 40 mg by mouth daily.    Yes [provider]  Multiple Vitamins-Minerals (CENTRUM SILVER PO) Take 1 tablet by mouth daily.    Yes [provider]  Omega-3 Fatty Acids (FISH OIL PO) Take 1 capsule by mouth daily.   Yes [provider]  sertraline (ZOLOFT) 50 MG tablet Take by mouth. Patient not taking: Reported on 03/21/2020    [provider]  tadalafil (CIALIS) 20 MG tablet Take 20 mg by mouth daily as needed for erectile dysfunction.  01/22/15   [provider]     Vital Signs: BP (!) 166/80   Pulse 69   Temp 98.3 F (36.8 C) (Oral)   Resp 18    Ht 6' (1.829 m)   Wt 177 lb (80.3 kg)   SpO2 100%   BMI 24.01 kg/m   Physical Exam awake, alert.  Chest clear to auscultation bilaterally.  Heart with regular rate and rhythm.  Abdomen soft, positive bowel sounds, nontender.  No lower extremity edema.  Imaging: DG Chest 1 View  Result Date: 04/04/2020 CLINICAL DATA:  Preoperative respiratory evaluation for cryoablation. EXAM: CHEST  1 VIEW COMPARISON:  12/15/2006 FINDINGS: The lungs are clear without focal pneumonia, edema, pneumothorax or pleural effusion. The cardiopericardial silhouette is within normal limits for size. The visualized bony structures of the thorax show no acute abnormality. IMPRESSION: No acute cardiopulmonary findings. Electronically Signed   By: Misty Stanley M.D.   On: 04/04/2020 07:55    Labs:  CBC: Recent Labs    03/27/20 1500  WBC 9.8  HGB 12.4*  HCT 39.6  PLT 511*    COAGS: Recent Labs    03/27/20 1500  INR 0.9    BMP: Recent Labs    03/27/20 1500  NA 139  K 3.7  CL 105  CO2 26  GLUCOSE 98  BUN 14  CALCIUM 9.0  CREATININE 1.19  GFRNONAA 59*  GFRAA >60    LIVER FUNCTION TESTS: No results for input(s): BILITOT, AST, ALT, ALKPHOS, PROT, ALBUMIN in the last 8760 hours.  Assessment and Plan: 74 year old male smoker with hx incidentally noted 2.9 cm left renal mass on  CT at the [place] in March of this year which is suspicious for renal cell carcinoma; underwent tele consultation with Dr. Vernard Gambles on 03/13/2020 and deemed an appropriate candidate for CT-guided cryoablation and biopsy of the left renal mass.  He presents today for the procedure.  Details/risks of procedure, including but not limited to, internal bleeding, infection, injury to adjacent structures, anesthesia related complications discussed with patient with his understanding and consent.  Post procedure he will be admitted to the hospital for overnight observation.This procedure involves the use of CT and because of the nature of the  planned procedure, it is possible that we will have prolonged use of CT  Potential radiation risks to you include (but are not limited to) the following: - A slightly elevated risk for cancer  several years later in life. This risk is typically less than 0.5% percent. This risk is low in comparison to the normal incidence of human cancer, which is 33% for women and 50% for men according to the [employer]. - Radiation induced injury can include skin redness, resembling a rash, tissue breakdown / ulcers and hair loss (which can be temporary or permanent).   The likelihood of either of these occurring depends on the difficulty of the procedure and whether you are sensitive to radiation due to previous procedures, disease, or genetic conditions.   IF your procedure requires a prolonged use of radiation, you will be notified and given written instructions for further action.  It is your responsibility to monitor the irradiated area for the 2 weeks following the procedure and to notify your physician if you are concerned that you have suffered a radiation induced injury.      Electronically Signed: D. Rowe Robert, PA-C 04/04/2020, 8:13 AM   I spent a total of 30 minutes at the the patient's bedside AND on the patient's hospital floor or unit, greater than 50% of which was counseling/coordinating care for CT-guided cryoablation/possible biopsy of left renal mass

## 2020-04-04 NOTE — Transfer of Care (Signed)
Immediate Anesthesia Transfer of Care Note  Patient: [name]  Procedure(s) Performed: CT WITH ANESTHESIA CT CRYOABLATION (Left )  Patient Location: PACU  Anesthesia Type:General  Level of Consciousness: awake  Airway & Oxygen Therapy: Patient Spontanous Breathing and Patient connected to face mask oxygen  Post-op Assessment: Report given to RN and Post -op Vital signs reviewed and stable  Post vital signs: Reviewed and stable  Last Vitals:  Vitals Value Taken Time  BP 195/86 04/04/20 1210  Temp    Pulse 59 04/04/20 1214  Resp 12 04/04/20 1214  SpO2 100 % 04/04/20 1214  Vitals shown include unvalidated device data.  Last Pain:  Vitals:   04/04/20 0607  TempSrc: Oral      Patients Stated Pain Goal: 4 (79/44/46 1901)  Complications: No complications documented.

## 2020-04-04 NOTE — Sedation Documentation (Signed)
Anesthesia in to sedate and monitor. 

## 2020-04-05 ENCOUNTER — Encounter (HOSPITAL_COMMUNITY): Payer: Self-pay | Admitting: Interventional Radiology

## 2020-04-05 DIAGNOSIS — C649 Malignant neoplasm of unspecified kidney, except renal pelvis: Secondary | ICD-10-CM | POA: Diagnosis not present

## 2020-04-05 LAB — CBC WITH DIFFERENTIAL/PLATELET
Abs Immature Granulocytes: 0.06 10*3/uL (ref 0.00–0.07)
Basophils Absolute: 0.1 10*3/uL (ref 0.0–0.1)
Basophils Relative: 0 %
Eosinophils Absolute: 0 10*3/uL (ref 0.0–0.5)
Eosinophils Relative: 0 %
HCT: 37.6 % — ABNORMAL LOW (ref 39.0–52.0)
Hemoglobin: 12 g/dL — ABNORMAL LOW (ref 13.0–17.0)
Immature Granulocytes: 0 %
Lymphocytes Relative: 13 %
Lymphs Abs: 2 10*3/uL (ref 0.7–4.0)
MCH: 24.9 pg — ABNORMAL LOW (ref 26.0–34.0)
MCHC: 31.9 g/dL (ref 30.0–36.0)
MCV: 78 fL — ABNORMAL LOW (ref 80.0–100.0)
Monocytes Absolute: 1.6 10*3/uL — ABNORMAL HIGH (ref 0.1–1.0)
Monocytes Relative: 10 %
Neutro Abs: 12.3 10*3/uL — ABNORMAL HIGH (ref 1.7–7.7)
Neutrophils Relative %: 77 %
Platelets: 430 10*3/uL — ABNORMAL HIGH (ref 150–400)
RBC: 4.82 MIL/uL (ref 4.22–5.81)
RDW: 17.2 % — ABNORMAL HIGH (ref 11.5–15.5)
WBC: 16 10*3/uL — ABNORMAL HIGH (ref 4.0–10.5)
nRBC: 0 % (ref 0.0–0.2)

## 2020-04-05 LAB — BASIC METABOLIC PANEL
Anion gap: 9 (ref 5–15)
BUN: 15 mg/dL (ref 8–23)
CO2: 24 mmol/L (ref 22–32)
Calcium: 8.6 mg/dL — ABNORMAL LOW (ref 8.9–10.3)
Chloride: 104 mmol/L (ref 98–111)
Creatinine, Ser: 1.3 mg/dL — ABNORMAL HIGH (ref 0.61–1.24)
GFR calc Af Amer: >60 mL/min (ref >60)
GFR calc non Af Amer: 53 mL/min — ABNORMAL LOW (ref >60)
Glucose, Bld: 108 mg/dL — ABNORMAL HIGH (ref 70–99)
Potassium: 3.6 mmol/L (ref 3.5–5.1)
Sodium: 137 mmol/L (ref 135–145)

## 2020-04-05 LAB — SURGICAL PATHOLOGY

## 2020-04-05 MED ORDER — HYDROCODONE-ACETAMINOPHEN 5-325 MG PO TABS: 1.0000 | ORAL_TABLET | Freq: Four times a day (QID) | ORAL | 0 refills | Status: DC | PRN

## 2020-04-05 NOTE — Discharge Instructions (Signed)
Cryoablation, Care After This sheet gives you information about how to care for yourself after your procedure. Your health care provider may also give you more specific instructions. If you have problems or questions, contact your health care provider. What can I expect after the procedure? After the procedure, it is common to have:  Soreness around the treatment area.  Mild pain and swelling in the treatment area. Follow these instructions at home: Treatment area care   Follow instructions from your health care provider about how to take care of your incision. Make sure you: ? Wash your hands with soap and water before you change your bandage (dressing). If soap and water are not available, use hand sanitizer. ? Change your dressing as told by your health care provider. ? Leave stitches (sutures) in place. They may need to stay in place for 2 weeks or longer.  Check your treatment area every day for signs of infection. Check for: ? More redness, swelling, or pain. ? More fluid or blood. ? Warmth. ? Pus or a bad smell.  Keep the treated area clean, dry, and covered with a dressing until it has healed. Clean the area with soap and water or as told by your health care provider.  You may shower if your health care provider approves. If your bandage gets wet, change it right away. Activity  Follow instructions from your health care provider about any activity limitations.  Do not drive for 24 hours if you received a medicine to help you relax (sedative). General instructions  Take over-the-counter and prescription medicines only as told by your health care provider.  Keep all follow-up visits as told by your health care provider. This is important. Contact a health care provider if:  You do not have a bowel movement for 2 days.  You have nausea or vomiting.  You have more redness, swelling, or pain around your treatment area.  You have more fluid or blood coming from your  treatment area.  Your treatment area feels warm to the touch.  You have pus or a bad smell coming from your treatment area.  You have a fever. Get help right away if:  You have severe pain.  You have trouble swallowing or breathing.  You have severe weakness or dizziness.  You have chest pain or shortness of breath. This information is not intended to replace advice given to you by your health care provider. Make sure you discuss any questions you have with your health care provider. Document Revised: 06/26/2017 Document Reviewed: 12/12/2015 Elsevier Patient Education  2020 [employer]  

## 2020-04-05 NOTE — Care Management Important Message (Signed)
Important Message  Patient Details IM Letter given to the Patient Name: George Olsen MRN: [id number] Date of Birth: Oct 27, 1945   Medicare Important Message Given:  Yes     Kerin Salen 04/05/2020, 11:21 AM

## 2020-04-05 NOTE — Discharge Summary (Signed)
George Olsen ID: George Olsen MRN: [id number] DOB/AGE: 1946/02/02 74 y.o.  Admit date: 04/04/2020 Discharge date: 04/05/2020  Supervising Physician: Arne Cleveland  George Olsen Status: [facility] - In-pt  Admission Diagnoses: left renal mass  Discharge Diagnoses: left renal mass, s/p CT guided core biopsy/cryoablation via general anesthesia on 04/04/20 Active Problems:   Renal mass  Past Medical History:  Diagnosis Date   Constipation    Depression    Gout    Hypertension    Smoker    Past Surgical History:  Procedure Laterality Date   COLONOSCOPY  07/12/2009   COLONOSCOPY  09/19/2019   FINGER SURGERY     IR RADIOLOGIST EVAL & MGMT  03/13/2020   RADIOLOGY WITH ANESTHESIA Left 04/04/2020   Procedure: CT WITH ANESTHESIA CT CRYOABLATION;  Surgeon: Arne Cleveland, MD;  Location: [facility];  Service: Radiology;  Laterality: Left;     Discharged Condition: good  Hospital Course: Mr. George Olsen is a 74 year old male who was incidentally found to have a 2.9 cm left renal mass in March of this year which was suspicious for renal cell carcinoma.  He underwent tele consultation with Dr. Vernard Gambles on 03/13/2020 to discuss treatment options and was deemed an appropriate candidate for CT-guided biopsy and cryoablation of the left renal mass.  He underwent the procedure at [facility] via general anesthesia on 04/04/2020.  The procedure was performed without immediate complications and he was subsequently admitted to the hospital for overnight observation.  Overnight he did well with exception of some minimal left upper quadrant/epigastric discomfort.  He was given Norco for pain.  On the day of discharge he was stable.  He reported only minimal epigastric discomfort.  He denied fever, chills, headache, chest pain, dyspnea, cough, back pain, nausea, vomiting or hematuria/dysuria.  He was able to tolerate his diet, ambulate and void without significant difficulty.  Follow-up labs revealed WBC 16 which is not  unexpected post ablation, hemoglobin stable at 12, platelets 430k, potassium 3.6, creatinine 1.3.  Findings were discussed with Dr. Vernard Gambles and George Olsen was deemed stable for discharge at this time.  He will resume his usual home medications.  Prescription for Norco 5/325, #20, no refill, 1 to 2 tablets every 6 hours as needed for pain electronically sent to George Olsen's pharmacy.  George Olsen was advised to use Tylenol to begin with for minimal pain and if insufficient may use Norco at prescribed intervals.  He will be scheduled for follow-up call with Dr. Vernard Gambles from [facility] in 3 to 4 weeks.  Follow-up imaging will be performed in 3 months.  He was told to contact our service with any additional questions or concerns.  He will continue urologic care with Dr. Diona Fanti.  Consults: none  Significant Diagnostic Studies:  Results for orders placed or performed during the hospital encounter of 04/04/20  SARS Coronavirus 2 by RT PCR (hospital order, performed in [place] hospital lab) Nasopharyngeal Nasopharyngeal Swab   Specimen: Nasopharyngeal Swab  Result Value Ref Range   SARS Coronavirus 2 NEGATIVE NEGATIVE  CBC with Differential/Platelet  Result Value Ref Range   WBC 16.0 (H) 4.0 - 10.5 K/uL   RBC 4.82 4.22 - 5.81 MIL/uL   Hemoglobin 12.0 (L) 13.0 - 17.0 g/dL   HCT 37.6 (L) 39 - 52 %   MCV 78.0 (L) 80.0 - 100.0 fL   MCH 24.9 (L) 26.0 - 34.0 pg   MCHC 31.9 30.0 - 36.0 g/dL   RDW 17.2 (H) 11.5 - 15.5 %   Platelets  430 (H) 150 - 400 K/uL   nRBC 0.0 0.0 - 0.2 %   Neutrophils Relative % 77 %   Neutro Abs 12.3 (H) 1.7 - 7.7 K/uL   Lymphocytes Relative 13 %   Lymphs Abs 2.0 0.7 - 4.0 K/uL   Monocytes Relative 10 %   Monocytes Absolute 1.6 (H) 0 - 1 K/uL   Eosinophils Relative 0 %   Eosinophils Absolute 0.0 0 - 0 K/uL   Basophils Relative 0 %   Basophils Absolute 0.1 0 - 0 K/uL   Immature Granulocytes 0 %   Abs Immature Granulocytes 0.06 0.00 - 0.07 K/uL  Basic metabolic panel  Result  Value Ref Range   Sodium 137 135 - 145 mmol/L   Potassium 3.6 3.5 - 5.1 mmol/L   Chloride 104 98 - 111 mmol/L   CO2 24 22 - 32 mmol/L   Glucose, Bld 108 (H) 70 - 99 mg/dL   BUN 15 8 - 23 mg/dL   Creatinine, Ser 1.30 (H) 0.61 - 1.24 mg/dL   Calcium 8.6 (L) 8.9 - 10.3 mg/dL   GFR calc non Af Amer 53 (L) >60 mL/min   GFR calc Af Amer >60 >60 mL/min   Anion gap 9 5 - 15  ABO/Rh  Result Value Ref Range   ABO/RH(D)      O POS Performed at [facility], [place] [address], [place], [place] [address]      Treatments: CT-guided core biopsy and cryoablation of left renal mass via general anesthesia on 04/04/2020  Discharge Exam: Blood pressure (!) 142/68, pulse (!) 57, temperature 98.6 F (37 C), temperature source Oral, resp. rate 14, height 6' (1.829 m), weight 177 lb (80.3 kg), SpO2 94 %. Awake, alert.  Chest clear to auscultation bilaterally.  Heart with slightly bradycardic but regular rhythm.  Abdomen soft, positive bowel sounds, some minimal epigastric tenderness to palpation.  No lower extremity edema.  Puncture sites left flank clean, dry, nontender, no hematoma.  Disposition: Discharge disposition: 01-Home or Self Care       Discharge Instructions    Call MD for:  difficulty breathing, headache or visual disturbances   Complete by: As directed    Call MD for:  extreme fatigue   Complete by: As directed    Call MD for:  hives   Complete by: As directed    Call MD for:  persistant dizziness or light-headedness   Complete by: As directed    Call MD for:  persistant nausea and vomiting   Complete by: As directed    Call MD for:  redness, tenderness, or signs of infection (pain, swelling, redness, odor or green/yellow discharge around incision site)   Complete by: As directed    Call MD for:  severe uncontrolled pain   Complete by: As directed    Call MD for:  temperature >100.4   Complete by: As directed    Change dressing (specify)   Complete by: As  directed    May Place Band-Aid over puncture site left flank if necessary for the next 2 to 3 days.  May change bandage daily.  May wash site with soap and water.   Diet - low sodium heart healthy   Complete by: As directed    Discharge instructions   Complete by: As directed    Continue current home medications; stay well-hydrated preferably with water; may take regular Tylenol as needed for mild pain; do not mix Tylenol and Norco together  Driving Restrictions   Complete by: As directed    No driving for the next 24 hours   Increase activity slowly   Complete by: As directed    Lifting restrictions   Complete by: As directed    No heavy lifting for the next 3 to 4 days   May shower / Bathe   Complete by: As directed    May walk up steps   Complete by: As directed      Allergies as of 04/05/2020      Reactions   Statins Other (See Comments)   myalgia      Medication List    STOP taking these medications   sertraline 50 MG tablet Commonly known as: ZOLOFT     TAKE these medications   allopurinol 100 MG tablet Commonly known as: ZYLOPRIM Take 100 mg by mouth daily.   amLODipine 10 MG tablet Commonly known as: NORVASC Take 10 mg by mouth daily.   benazepril 40 MG tablet Commonly known as: LOTENSIN Take 40 mg by mouth daily.   CENTRUM SILVER PO Take 1 tablet by mouth daily.   FISH OIL PO Take 1 capsule by mouth daily.   HYDROcodone-acetaminophen 5-325 MG tablet Commonly known as: NORCO/VICODIN Take 1-2 tablets by mouth every 6 (six) hours as needed for moderate pain.   tadalafil 20 MG tablet Commonly known as: CIALIS Take 20 mg by mouth daily as needed for erectile dysfunction.            Discharge Care Instructions  (From admission, onward)         Start     Ordered   04/05/20 0000  Change dressing (specify)       Comments: May Place Band-Aid over puncture site left flank if necessary for the next 2 to 3 days.  May change bandage daily.  May wash  site with soap and water.   04/05/20 1030          Follow-up Information    Arne Cleveland, MD Follow up.   Specialties: Interventional Radiology, Radiology Why: Radiology will call you in follow-up with Dr. Vernard Gambles in 3 to 4 weeks; call [phone] or [phone] with any questions Contact information: [address] [place] [address] [phone]        Franchot Gallo, MD Follow up.   Specialty: Urology Why: Continue care with Dr. Diona Fanti as scheduled. Contact information: [place] [address] [phone]                Electronically Signed: D. Rowe Robert, PA-C 04/05/2020, 10:33 AM   I have spent Less Than 30 Minutes discharging George Olsen.

## 2020-04-06 ENCOUNTER — Encounter: Payer: Self-pay | Admitting: *Deleted

## 2020-04-23 ENCOUNTER — Other Ambulatory Visit: Payer: Self-pay

## 2020-04-23 ENCOUNTER — Encounter (HOSPITAL_BASED_OUTPATIENT_CLINIC_OR_DEPARTMENT_OTHER): Payer: Self-pay | Admitting: Plastic Surgery

## 2020-04-24 ENCOUNTER — Other Ambulatory Visit (HOSPITAL_COMMUNITY)
Admission: RE | Admit: 2020-04-24 | Discharge: 2020-04-24 | Disposition: A | Discharge status: Home / Self Care | Payer: Medicare Other | Source: Ambulatory Visit | Attending: Plastic Surgery | Admitting: Plastic Surgery

## 2020-04-24 DIAGNOSIS — Z20822 Contact with and (suspected) exposure to covid-19: Secondary | ICD-10-CM | POA: Diagnosis not present

## 2020-04-24 DIAGNOSIS — Z01812 Encounter for preprocedural laboratory examination: Secondary | ICD-10-CM | POA: Insufficient documentation

## 2020-04-24 LAB — SARS CORONAVIRUS 2 (TAT 6-24 HRS): SARS Coronavirus 2: NEGATIVE

## 2020-04-25 ENCOUNTER — Other Ambulatory Visit: Payer: Self-pay

## 2020-04-25 ENCOUNTER — Encounter: Payer: Self-pay | Admitting: Plastic Surgery

## 2020-04-25 ENCOUNTER — Ambulatory Visit
Admission: RE | Admit: 2020-04-25 | Discharge: 2020-04-25 | Disposition: A | Discharge status: Home / Self Care | Payer: Medicare Other | Source: Ambulatory Visit | Attending: Radiology | Admitting: Radiology

## 2020-04-25 ENCOUNTER — Ambulatory Visit (INDEPENDENT_AMBULATORY_CARE_PROVIDER_SITE_OTHER): Payer: Medicare Other | Admitting: Plastic Surgery

## 2020-04-25 VITALS — BP 169/70 | HR 68 | Temp 98.9°F

## 2020-04-25 DIAGNOSIS — C44390 Other specified malignant neoplasm of skin of unspecified parts of face: Secondary | ICD-10-CM | POA: Diagnosis not present

## 2020-04-25 DIAGNOSIS — N2889 Other specified disorders of kidney and ureter: Secondary | ICD-10-CM

## 2020-04-25 NOTE — Progress Notes (Signed)
   Referring Provider No referring provider defined for this encounter.   CC:  Chief Complaint  Patient presents with  . Follow-up      George Olsen is an 74 y.o. male.  HPI: Patient presents post procedure from a Mohs excision of a skin cancer in the left zygomatic area.  He is here for me to assess the defect and plan reconstruction.  Review of Systems General: Denies fevers and chills  Physical Exam Vitals with BMI 04/25/2020 04/23/2020 04/05/2020  Height - 6\' 0"  -  Weight - 169 lbs -  BMI - 64.33 -  Systolic 295 - 188  Diastolic 70 - 68  Pulse 68 - 57    General:  No acute distress,  Alert and oriented, Non-Toxic, Normal speech and affect Eye examination he has about a 3.5 x 1.5 cm defect in the left zygomatic area.  This is approaching the lateral canthus and lower lid.  He seems to have normal facial nerve function and normal position of his eyelid that is maintained..  There is enough skin laxity to close this primarily.  Assessment/Plan Patient presents with a Mohs defect in the left zygomatic area.  This is amenable to primary closure.  We discussed the risks include bleeding, infection, damage to surrounding structures and need for additional procedures.  We discussed the orientation of the scar but I felt that primary closure was the simplest and ultimately best way to get the defect healed.  The patient is fully understanding and wants to proceed.  Operative Note   DATE OF OPERATION: 04/25/2020  LOCATION:    SURGICAL DEPARTMENT: Plastic Surgery  PREOPERATIVE DIAGNOSES: Left cheek Mohs defect  POSTOPERATIVE DIAGNOSES:  same  PROCEDURE:  1.  Complex closure left cheek totaling 3.5 cm in length  SURGEON: Talmadge Coventry, MD  ANESTHESIA:  Local  COMPLICATIONS: None.   INDICATIONS FOR PROCEDURE:  The patient, George Olsen is a 74 y.o. male born on November 08, 1945, is here for treatment of left cheek defect MRN: [id number]  CONSENT:  Informed consent was  obtained directly from the patient. Risks, benefits and alternatives were fully discussed. Specific risks including but not limited to bleeding, infection, hematoma, seroma, scarring, pain, infection, wound healing problems, and need for further surgery were all discussed. The patient did have an ample opportunity to have questions answered to satisfaction.   DESCRIPTION OF PROCEDURE:  Local anesthesia was administered. The patient's operative site was prepped and draped in a sterile fashion. A time out was performed and all information was confirmed to be correct.  Circumferential undermining was performed and the skin was advanced and closed in layers with interrupted buried Monocryl sutures and 5-0 fast gut for the skin.  The patient tolerated the procedure well.  There were no complications.     Cindra Presume 04/25/2020, 4:40 PM

## 2020-04-26 ENCOUNTER — Other Ambulatory Visit (HOSPITAL_COMMUNITY): Payer: Medicare Other

## 2020-04-27 ENCOUNTER — Ambulatory Visit (HOSPITAL_BASED_OUTPATIENT_CLINIC_OR_DEPARTMENT_OTHER): Admission: RE | Admit: 2020-04-27 | Payer: Medicare Other | Source: Home / Self Care | Admitting: Plastic Surgery

## 2020-04-27 SURGERY — IRRIGATION AND DEBRIDEMENT OF WOUND WITH SPLIT THICKNESS SKIN GRAFT
Anesthesia: General | Site: Face | Laterality: Left

## 2020-05-03 ENCOUNTER — Ambulatory Visit: Payer: Medicare Other | Admitting: Plastic Surgery

## 2020-05-08 NOTE — Progress Notes (Signed)
   Subjective:     Patient ID: George Olsen, male    DOB: Apr 03, 1946, 74 y.o.   MRN: [id number]  Chief Complaint  Patient presents with  . Skin Cancer    HPI: The patient is a 74 y.o. male here for follow-up after undergoing complex closure of left cheek Mohs defect on 04/25/20 with Dr. Claudia Desanctis.  ~ 2 weeks PO Patient reports he is doing very well.  No complaints.  Denies fever/chills, nausea/vomiting, pain.  Incision on left cheek is healing very nicely, C/D/I.  No signs of infection, redness, drainage, seroma/hematoma.  Review of Systems  Constitutional: Negative for chills and fever.  HENT: Negative for congestion and sore throat.   Respiratory: Negative for cough and shortness of breath.   Cardiovascular: Negative for chest pain.  Gastrointestinal: Negative for abdominal pain, nausea and vomiting.  Musculoskeletal: Negative for back pain, myalgias and neck pain.  Skin: Negative for rash.     Objective:   Vital Signs BP (!) 150/86 (BP Location: Left Arm, Patient Position: Sitting, Cuff Size: Large)   Pulse 66   Temp 97.8 F (36.6 C) (Temporal)   SpO2 99%  Vital Signs and Nursing Note Reviewed  Physical Exam Constitutional:      General: He is not in acute distress.    Appearance: Normal appearance. He is normal weight. He is not ill-appearing.  HENT:     Head: Normocephalic and atraumatic.     Comments: Incision on left cheek is healing very nicely, C/D/I.  No signs of infection, redness, drainage, seroma/hematoma. Eyes:     Extraocular Movements: Extraocular movements intact.  Pulmonary:     Effort: Pulmonary effort is normal.  Musculoskeletal:        General: Normal range of motion.     Cervical back: Normal range of motion.  Skin:    General: Skin is warm and dry.     Coloration: Skin is not pale.     Findings: No erythema or rash.  Neurological:     Mental Status: He is alert and oriented to person, place, and time.     Gait: Gait is intact.  Psychiatric:          Mood and Affect: Mood and affect normal.        Behavior: Behavior normal.        Thought Content: Thought content normal.        Cognition and Memory: Memory normal.        Judgment: Judgment normal.         Assessment/Plan:     ICD-10-CM   1. 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 George Olsen is doing very well.  Incision is healing nicely, C/D/I.  No signs of infection, redness, drainage, seroma/hematoma.  Patient denies pain.  Follow-up as needed.  Call office with any questions/concerns.  The [place] was signed into law in 2016 which includes the topic of electronic health records.  This provides immediate access to information in MyChart.  This includes consultation notes, operative notes, office notes, lab results and pathology reports.  If you have any questions about what you read please let us know at your next visit or call us at the office.  We are right here with you.   Threasa Heads, PA-C 05/10/2020, 9:16 AM

## 2020-05-10 ENCOUNTER — Encounter: Payer: Self-pay | Admitting: Plastic Surgery

## 2020-05-10 ENCOUNTER — Other Ambulatory Visit: Payer: Self-pay

## 2020-05-10 ENCOUNTER — Ambulatory Visit (INDEPENDENT_AMBULATORY_CARE_PROVIDER_SITE_OTHER): Payer: Medicare Other | Admitting: Plastic Surgery

## 2020-05-10 VITALS — BP 150/86 | HR 66 | Temp 97.8°F

## 2020-05-10 DIAGNOSIS — C44300 Unspecified malignant neoplasm of skin of unspecified part of face: Secondary | ICD-10-CM

## 2020-07-16 ENCOUNTER — Other Ambulatory Visit: Payer: Self-pay | Admitting: Interventional Radiology

## 2020-07-16 DIAGNOSIS — N2889 Other specified disorders of kidney and ureter: Secondary | ICD-10-CM

## 2020-08-03 ENCOUNTER — Encounter (HOSPITAL_COMMUNITY): Payer: Self-pay

## 2020-08-03 ENCOUNTER — Ambulatory Visit (HOSPITAL_COMMUNITY)
Admission: RE | Admit: 2020-08-03 | Discharge: 2020-08-03 | Disposition: A | Discharge status: Home / Self Care | Payer: Medicare Other | Source: Ambulatory Visit | Attending: Interventional Radiology | Admitting: Interventional Radiology

## 2020-08-03 ENCOUNTER — Other Ambulatory Visit: Payer: Self-pay

## 2020-08-03 DIAGNOSIS — N2889 Other specified disorders of kidney and ureter: Secondary | ICD-10-CM | POA: Diagnosis not present

## 2020-08-03 HISTORY — DX: Malignant (primary) neoplasm, unspecified: C80.1

## 2020-08-03 LAB — POCT I-STAT CREATININE: Creatinine, Ser: 1.2 mg/dL (ref 0.61–1.24)

## 2020-08-03 MED ORDER — IOHEXOL 300 MG/ML  SOLN
100.0000 mL | Freq: Once | INTRAMUSCULAR | Status: AC | PRN
  Administered 2020-08-03: 100 mL via INTRAVENOUS

## 2020-08-07 ENCOUNTER — Encounter: Payer: Self-pay | Admitting: *Deleted

## 2020-08-07 ENCOUNTER — Ambulatory Visit
Admission: RE | Admit: 2020-08-07 | Discharge: 2020-08-07 | Disposition: A | Discharge status: Home / Self Care | Payer: Medicare Other | Source: Ambulatory Visit | Attending: Radiology | Admitting: Radiology

## 2020-08-07 ENCOUNTER — Other Ambulatory Visit: Payer: Self-pay

## 2020-08-07 ENCOUNTER — Other Ambulatory Visit (HOSPITAL_COMMUNITY): Payer: Self-pay | Admitting: Interventional Radiology

## 2020-08-07 DIAGNOSIS — C642 Malignant neoplasm of left kidney, except renal pelvis: Secondary | ICD-10-CM

## 2020-08-07 HISTORY — PX: IR RADIOLOGIST EVAL & MGMT: IMG5224

## 2020-08-07 NOTE — Progress Notes (Signed)
Chief Complaint: Patient was consulted remotely today (TeleHealth) for follow-up renal lesion ablation at the request of Allred,Darrell K.    Referring Physician(s): Preston Fleeting  History of Present Illness: George Olsen is a 75 y.o. male who on  10/07/2019 obtained CT at the [facility] for indeterminate reason, and left renal mass incidentally noted. No previous imaging available. No hematuria or flank pain. No history of renal insufficiency or dialysis. No anticoagulant use. 04/04/2020 underwent CT-guided core biopsy and ablation of the left renal lesion.  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 He did well postprocedure with only minimal flank pain, discharged the following day as scheduled.  He has done well since, no left flank pain.  No   hematuria.  Renal function stable. 08/03/2020 CT abdomen without/with contrast demonstrates ablation defect, no residual or recurrent enhancing tumor evident, no unexpected findings.    Past Medical History:  Diagnosis Date  . Constipation   . Depression   . Gout   . Hypertension   . left renal ca dx'd 03/2020  . Smoker     Past Surgical History:  Procedure Laterality Date  . COLONOSCOPY  07/12/2009  . COLONOSCOPY  09/19/2019  . FINGER SURGERY    . IR RADIOLOGIST EVAL & MGMT  03/13/2020  . RADIOLOGY WITH ANESTHESIA Left 04/04/2020   Procedure: CT WITH ANESTHESIA CT CRYOABLATION;  Surgeon: Arne Cleveland, MD;  Location: [facility];  Service: Radiology;  Laterality: Left;    Allergies: Patient has no known allergies.  Medications: Prior to Admission medications   Medication Sig Start Date End Date Taking? Authorizing Provider  allopurinol (ZYLOPRIM) 100 MG tablet Take 100 mg by mouth daily. 07/10/19   [provider]  amLODipine (NORVASC) 10 MG tablet Take 10 mg by mouth daily.    [provider]  benazepril (LOTENSIN) 40 MG tablet Take 40 mg by mouth daily.     [provider]  HYDROcodone-acetaminophen (NORCO/VICODIN) 5-325 MG tablet Take 1-2 tablets by mouth every 6 (six) hours as needed for moderate pain. 04/05/20   Allred, Darrell K, PA-C  Multiple Vitamins-Minerals (CENTRUM SILVER PO) Take 1 tablet by mouth daily.     [provider]  Omega-3 Fatty Acids (FISH OIL PO) Take 1 capsule by mouth daily.    [provider]  tadalafil (CIALIS) 20 MG tablet Take 20 mg by mouth daily as needed for erectile dysfunction.  01/22/15   [provider]     Family History  Problem Relation Age of Onset  . Esophageal cancer Neg Hx   . Colon polyps Neg Hx   . Colon cancer Neg Hx   . Rectal cancer Neg Hx   . Stomach cancer Neg Hx     Social History   Socioeconomic History  . Marital status: Single    Spouse name: Not on file  . Number of children: 2  . Years of education: 85  . Highest education level: Not on file  Occupational History  . Not on file  Tobacco Use  . Smoking status: Current Every Day Smoker    Packs/day: 1.00    Types: Cigarettes  . Smokeless tobacco: Never Used  Vaping Use  . Vaping Use: Never used  Substance and Sexual Activity  . Alcohol use: Not Currently  . Drug use: Yes    Types: Marijuana    Comment: last use 2 weeks ago   . Sexual activity: Not on file  Other Topics Concern  .  Not on file  Social History Narrative  . Not on file   Social Determinants of Health   Financial Resource Strain: Not on file  Food Insecurity: Not on file  Transportation Needs: Not on file  Physical Activity: Not on file  Stress: Not on file  Social Connections: Not on file    ECOG Status: 0 - Asymptomatic  Review of Systems  Review of Systems: A 12 point ROS discussed and pertinent positives are indicated in the HPI above.  All other systems are negative.  Physical Exam No direct physical exam was performed (except for noted visual exam findings with Video Visits).     Vital Signs: There were no vitals taken for  this visit.  Imaging: CT ABDOMEN W WO CONTRAST  Result Date: 08/03/2020 CLINICAL DATA:  Follow-up renal ablation. LEFT renal lesion ablation on 04/05/2019. EXAM: CT ABDOMEN WITHOUT AND WITH CONTRAST TECHNIQUE: Multidetector CT imaging of the abdomen was performed following the standard protocol before and following the bolus administration of intravenous contrast. CONTRAST:  165mL OMNIPAQUE IOHEXOL 300 MG/ML  SOLN COMPARISON:  Procedural CT 04/04/2020 FINDINGS: Lower chest:  Lung bases are clear. Hepatobiliary: 7 mm round arterial enhancing lesion inferior RIGHT hepatic lobe on image 62/6. This lesion rapidly washes out and is not evident on the more delayed vascular series suggesting a benign vascular lesion. Gallbladder normal. Pancreas: Normal pancreatic parenchymal intensity. No ductal dilatation or inflammation. Spleen: Normal spleen. Adrenals/urinary tract: Adrenal glands normal. Tissue at the LEFT cryoablation site is similar in volume to a pre therapy exam measuring 2.9 x 2.7 cm compared with 2.8 x 2.2 cm. There is no clear post-contrast enhancement of this tissue at the ablation site. (Series 3 and series 7 and series 11). No new nodularity. Elsewhere within LEFT and RIGHT kidney there is no enhancing lesion. Bilateral renal cortical thinning. Stomach/Bowel: Stomach and limited of the small bowel is unremarkable Vascular/Lymphatic: Abdominal aortic normal caliber. No retroperitoneal periportal lymphadenopathy. Musculoskeletal: No aggressive osseous lesion IMPRESSION: 1. No enhancing tissue or nodularity at the cryoablation site upper pole LEFT kidney. Lesion similar volume to pre therapy with mild expansion. Recommend routine surveillance. 2. Small hypervascular lesion RIGHT hepatic lobe is favored benign vascular phenomena. Electronically Signed   By: Suzy Bouchard M.D.   On: 08/03/2020 10:09    Labs:  CBC: Recent Labs    03/27/20 1500 04/05/20 0608  WBC 9.8 16.0*  HGB 12.4* 12.0*  HCT  39.6 37.6*  PLT 511* 430*    COAGS: Recent Labs    03/27/20 1500  INR 0.9    BMP: Recent Labs    03/27/20 1500 04/05/20 0608 08/03/20 0733  NA 139 137  --   K 3.7 3.6  --   CL 105 104  --   CO2 26 24  --   GLUCOSE 98 108*  --   BUN 14 15  --   CALCIUM 9.0 8.6*  --   CREATININE 1.19 1.30* 1.20  GFRNONAA 59* 53*  --   GFRAA >60 >60  --     LIVER FUNCTION TESTS: No results for input(s): BILITOT, AST, ALT, ALKPHOS, PROT, ALBUMIN in the last 8760 hours.  TUMOR MARKERS: No results for input(s): AFPTM, CEA, CA199, CHROMGRNA in the last 8760 hours.  Assessment and Plan:  My impression is that this patient has done very well post CT-guided cryoablation of his papillary renal cell carcinoma.  Follow-up CT shows no suggestion of residual or recurrent tumor, no evidence of metastatic disease.  He remains asymptomatic from this lesion.  Findings support curative treatment.  I discussed the findings with the patient.  We will continue surveillance imaging, next in 4-6 months, until stability X 5 years confirmed.  He knows to call in the interval with any questions or problems.  Thank you for this interesting consult.  I greatly enjoyed meeting Kalev Temme and look forward to participating in their care.  A copy of this report was sent to the requesting provider on this date.  Electronically Signed: Rickard Rhymes 08/07/2020, 9:37 AM   I spent a total of    15 Minutes in remote  clinical consultation, greater than 50% of which was counseling/coordinating care for left papillary renal cell carcinoma, post cryoablation.    Visit type: Audio only (telephone). Audio (no video) only due to patient's lack of internet/smartphone capability. Alternative for in-person consultation at [facility], [address] [place], [place], [place]. This visit type was conducted due to national recommendations for restrictions regarding the COVID-19 Pandemic (e.g. social distancing).  This  format is felt to be most appropriate for this patient at this time.  All issues noted in this document were discussed and addressed.

## 2020-11-12 ENCOUNTER — Ambulatory Visit (HOSPITAL_COMMUNITY)
Admission: EM | Admit: 2020-11-12 | Discharge: 2020-11-12 | Disposition: A | Discharge status: Home / Self Care | Payer: Medicare Other | Attending: Urology | Admitting: Urology

## 2020-11-12 DIAGNOSIS — R45851 Suicidal ideations: Secondary | ICD-10-CM | POA: Insufficient documentation

## 2020-11-12 DIAGNOSIS — F32A Depression, unspecified: Secondary | ICD-10-CM | POA: Diagnosis not present

## 2020-11-12 DIAGNOSIS — F320 Major depressive disorder, single episode, mild: Secondary | ICD-10-CM

## 2020-11-12 MED ORDER — ESCITALOPRAM OXALATE 10 MG PO TABS: 10.0000 mg | ORAL_TABLET | Freq: Every day | ORAL | 0 refills | Status: AC

## 2020-11-12 MED ORDER — HYDROXYZINE HCL 25 MG PO TABS: 25.0000 mg | ORAL_TABLET | Freq: Three times a day (TID) | ORAL | 0 refills | Status: AC | PRN

## 2020-11-12 NOTE — ED Provider Notes (Signed)
[facility] Medical Screening Exam  Patient Name: George Olsen MRN: [id number] Date of Evaluation: 11/12/20 Chief Complaint:   Diagnosis:  Final diagnoses:  None    History of Present illness: George Olsen is a 75 y.o. male.  Patient voluntarily presented to [facility] via law enforcement.  Patient presented with a chief complaint of "depression, suicidal thoughts, and feeling stressed out."  Patient reports that over the past 3 weeks he has been feeling very stressed and has had issues staying asleep through the night, waking up every day around 2 AM.  Patient reports that he has been worrying more than usual.  He reports that his stressors are financial difficulties and work related stress.  Patient reported that he contacted law enforcement because he realized that he needed help with dealing with depression. He reports that his depressive symptoms include poor sleep, sadness, tiredness, and worrying. He endorsed on and off suicidal thoughts with plans of driving off the a clip for ~2 weeks.  Patient denies current suicidal ideation; he denies homicidal ideation, he denies visual/auditory hallucination, patient denies paranoia. Patient report that he has a gun at home however he states "I like living, I will never use it on myself, I have it in my room for protection." He report that he will be willing to give this gun to his sister or sell it for extra cash.   He denies alcohol and illicit drug use. He report at he is currently employed and looks forward to going to work today.   Patient contracts for safety however he is unwilling to provide collateral and state "I don't want anyone to know I came here for help."    Psychiatric Specialty Exam  Presentation  General Appearance:Appropriate for Environment  Eye Contact:Good  Speech:Clear and Coherent  Speech Volume:Normal  Handedness:Right   Mood and Affect  Mood:Euthymic  Affect:Appropriate   Thought Process   Thought Processes:Coherent  Descriptions of Associations:Intact  Orientation:Full (Time, Place and Person)  Thought Content:WDL    Hallucinations:None  Ideas of Reference:None  Suicidal Thoughts:Yes, Passive Without Plan  Homicidal Thoughts:No   Sensorium  Memory:Immediate Good; Recent Good; Remote Good  Judgment:Good  Insight:Good   Executive Functions  Concentration:Good  Attention Span:Good  [place]  Language:Good   Psychomotor Activity  Psychomotor Activity:Normal   Assets  Assets:Communication Skills; Desire for Improvement; Housing; Physical Health; Vocational/Educational   Sleep  Sleep:Fair  Number of hours: 6   No data recorded  Physical Exam: Physical Exam Vitals and nursing note reviewed.  Constitutional:      Appearance: He is well-developed.  HENT:     Head: Normocephalic and atraumatic.  Eyes:     Conjunctiva/sclera: Conjunctivae normal.  Cardiovascular:     Rate and Rhythm: Normal rate and regular rhythm.     Heart sounds: No murmur heard.   Pulmonary:     Effort: Pulmonary effort is normal. No respiratory distress.     Breath sounds: Normal breath sounds.  Abdominal:     Palpations: Abdomen is soft.     Tenderness: There is no abdominal tenderness.  Musculoskeletal:     Cervical back: Neck supple.  Skin:    General: Skin is warm and dry.     Coloration: Skin is not jaundiced or pale.  Neurological:     Mental Status: He is alert.  Psychiatric:        Attention and Perception: Attention normal. He perceives auditory hallucinations. He does not perceive visual hallucinations.  Mood and Affect: Mood and affect normal. Mood is not anxious or elated.        Speech: Speech normal.        Behavior: Behavior normal. Behavior is cooperative.        Cognition and Memory: Cognition normal.        Judgment: Judgment normal.    Review of Systems  Constitutional: Negative for chills, fever  and weight loss.  HENT: Negative for ear discharge and ear pain.   Eyes: Negative for pain and discharge.  Respiratory: Negative for cough and hemoptysis.   Cardiovascular: Negative for chest pain and palpitations.  Gastrointestinal: Negative for abdominal pain, constipation, diarrhea, nausea and vomiting.  Genitourinary: Negative.   Musculoskeletal: Negative.   Neurological: Negative.   Psychiatric/Behavioral: Positive for depression and suicidal ideas. Negative for hallucinations and substance abuse. The patient has insomnia. The patient is not nervous/anxious.    Blood pressure (!) 162/95, pulse 98, temperature 98.3 F (36.8 C), temperature source Oral, resp. rate 18, SpO2 100 %. There is no height or weight on file to calculate BMI.  Musculoskeletal: Strength & Muscle Tone: within normal limits Gait & Station: normal Patient leans: Right   [place] [facility] Discharge Disposition for Follow up and Recommendations: offer observation and continuous assessment in the ED for safety but patient refused. He doesn't meet criteria for [facility] due to age limit. Patient was started on antidepressant and anti-anxiety. He was also given outpatient resources by TTS counselor and encuraged to follow up with the [place], NP 11/12/2020, 5:51 AM

## 2020-11-12 NOTE — BH Assessment (Addendum)
Pt called police because of having suicidal thoughts tonight. He was brought to [facility] by police. Pt says that he will get dizzy then it will go away. He says that he has had depression for a long time. Pt denies any plan to kill himself. No previous attempt. Pt says he does have a gun which is in a safe in his bedroom. Pt days "I would never hurt myself." Patient lives by himself. He has no HI or A/V hallucinations. Patient denies use of ETOH, or other substances. Patient has not been inpatient at any psychiatric hospital before. He reports depression getting worse, has been getting up at night but is able to return to sleep. Pt still works still and works a full 40 hours week.  Pt declined to provide any collateral contacts due to not wanting anyone to know he was seeking help.  Pt is worried about how he is going to get back home before work.  He says he has guys waiting on him to be there to unlock things for him.    Clinician discussed patient care with Leandro Reasoner, NP who recommends outpatient & medication referral.

## 2020-11-12 NOTE — Discharge Instructions (Addendum)
  Discharge recommendations:  Patient is to take medications as prescribed. Please see information for follow-up appointment with psychiatry and therapy. Please follow up with your primary care provider for all medical related needs.    Therapy: We recommend that patient participate in individual therapy to address mental health concerns.  Medications: The parent/guardian is to contact a medical professional and/or outpatient provider to address any new side effects that develop. Parent/guardian should update outpatient providers of any new medications and/or medication changes.    Safety:  The patient should abstain from use of illicit substances/drugs and abuse of any medications. If symptoms worsen or do not continue to improve or if the patient becomes actively suicidal or homicidal then it is recommended that the patient return to the closest hospital emergency department, the [facility], or call 911 for further evaluation and treatment. National Suicide Prevention Lifeline 1-800-SUICIDE or 412-767-7803.

## 2021-01-01 ENCOUNTER — Other Ambulatory Visit: Payer: Self-pay | Admitting: Interventional Radiology

## 2021-01-01 ENCOUNTER — Other Ambulatory Visit: Payer: Self-pay

## 2021-01-01 DIAGNOSIS — N2889 Other specified disorders of kidney and ureter: Secondary | ICD-10-CM

## 2021-01-11 ENCOUNTER — Ambulatory Visit (HOSPITAL_COMMUNITY): Payer: Medicare Other

## 2021-01-11 ENCOUNTER — Encounter (HOSPITAL_COMMUNITY): Payer: Self-pay

## 2021-01-15 ENCOUNTER — Encounter (HOSPITAL_COMMUNITY): Payer: Self-pay

## 2021-01-15 ENCOUNTER — Ambulatory Visit (HOSPITAL_COMMUNITY): Payer: Medicare Other

## 2021-01-16 ENCOUNTER — Other Ambulatory Visit: Payer: Self-pay

## 2021-01-16 ENCOUNTER — Ambulatory Visit
Admission: RE | Admit: 2021-01-16 | Discharge: 2021-01-16 | Disposition: A | Discharge status: Home / Self Care | Payer: Medicare Other | Source: Ambulatory Visit | Attending: Interventional Radiology | Admitting: Interventional Radiology

## 2021-01-16 DIAGNOSIS — N2889 Other specified disorders of kidney and ureter: Secondary | ICD-10-CM

## 2021-01-18 ENCOUNTER — Ambulatory Visit (HOSPITAL_COMMUNITY)
Admission: RE | Admit: 2021-01-18 | Discharge: 2021-01-18 | Disposition: A | Discharge status: Home / Self Care | Payer: Medicare Other | Source: Ambulatory Visit | Attending: Interventional Radiology | Admitting: Interventional Radiology

## 2021-01-18 ENCOUNTER — Other Ambulatory Visit: Payer: Self-pay

## 2021-01-18 DIAGNOSIS — N2889 Other specified disorders of kidney and ureter: Secondary | ICD-10-CM | POA: Insufficient documentation

## 2021-01-18 LAB — POCT I-STAT CREATININE: Creatinine, Ser: 1.7 mg/dL — ABNORMAL HIGH (ref 0.61–1.24)

## 2021-01-18 MED ORDER — IOHEXOL 300 MG/ML  SOLN
100.0000 mL | Freq: Once | INTRAMUSCULAR | Status: AC | PRN
  Administered 2021-01-18: 80 mL via INTRAVENOUS

## 2021-02-14 ENCOUNTER — Ambulatory Visit
Admission: RE | Admit: 2021-02-14 | Discharge: 2021-02-14 | Disposition: A | Discharge status: Home / Self Care | Payer: Medicare Other | Source: Ambulatory Visit | Attending: Interventional Radiology | Admitting: Interventional Radiology

## 2021-02-14 ENCOUNTER — Other Ambulatory Visit: Payer: Self-pay

## 2021-02-14 ENCOUNTER — Encounter: Payer: Self-pay | Admitting: *Deleted

## 2021-02-14 HISTORY — PX: IR RADIOLOGIST EVAL & MGMT: IMG5224

## 2021-02-14 NOTE — Progress Notes (Signed)
Patient ID: George Olsen, male   DOB: [birth year], 75 y.o.   MRN: [id number]       Chief Complaint: Patient was consulted remotely today ([place]) for papillary renal cell carcinoma, post cryoablation, at the request of Noeli Lavery.    Referring Physician(s): Franchot Gallo MD  History of Present Illness: George Olsen is a 75 y.o. male who on  10/07/2019 obtained CT at the [facility] for indeterminate reason, and left renal mass incidentally noted. No previous imaging available. No hematuria or flank pain. No history of renal insufficiency or dialysis. No anticoagulant use. 04/04/2020 underwent CT-guided core biopsy and ablation of the left renal lesion.  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 He did well postprocedure with only minimal flank pain, discharged the following day as scheduled.  He has done well since, no left flank pain.  No   hematuria.  Renal function stable. 08/03/2020 CT abdomen without/with contrast demonstrates ablation defect, no residual or recurrent enhancing tumor evident, no unexpected findings.  He is doing well since then.  No flank pain or hematuria.  He does describe some problems with urinary hesitancy and flow.  He is otherwise doing well. He got a follow-up CT on 01/18/2021.  Past Medical History:  Diagnosis Date   Constipation    Depression    Gout    Hypertension    left renal ca dx'd 03/2020   Smoker     Past Surgical History:  Procedure Laterality Date   COLONOSCOPY  07/12/2009   COLONOSCOPY  09/19/2019   FINGER SURGERY     IR RADIOLOGIST EVAL & MGMT  03/13/2020   IR RADIOLOGIST EVAL & MGMT  08/07/2020   RADIOLOGY WITH ANESTHESIA Left 04/04/2020   Procedure: CT WITH ANESTHESIA CT CRYOABLATION;  Surgeon: Arne Cleveland, MD;  Location: [facility] ORS;  Service: Radiology;  Laterality: Left;    Allergies: Patient has no known allergies.  Medications: Prior to Admission medications   Medication Sig Start Date End Date  Taking? Authorizing Provider  allopurinol (ZYLOPRIM) 100 MG tablet Take 100 mg by mouth daily. 07/10/19   [provider]  amLODipine (NORVASC) 10 MG tablet Take 10 mg by mouth daily.    [provider]  benazepril (LOTENSIN) 40 MG tablet Take 40 mg by mouth daily.     [provider]  escitalopram (LEXAPRO) 10 MG tablet Take 1 tablet (10 mg total) by mouth daily. 11/12/20 12/12/20  Aj[place], Rosezella Florida A, NP  HYDROcodone-acetaminophen (NORCO/VICODIN) 5-325 MG tablet Take 1-2 tablets by mouth every 6 (six) hours as needed for moderate pain. 04/05/20   Allred, Darrell K, PA-C  Multiple Vitamins-Minerals (CENTRUM SILVER PO) Take 1 tablet by mouth daily.     [provider]  Omega-3 Fatty Acids (FISH OIL PO) Take 1 capsule by mouth daily.    [provider]  tadalafil (CIALIS) 20 MG tablet Take 20 mg by mouth daily as needed for erectile dysfunction.  01/22/15   [provider]     Family History  Problem Relation Age of Onset   Esophageal cancer Neg Hx    Colon polyps Neg Hx    Colon cancer Neg Hx    Rectal cancer Neg Hx    Stomach cancer Neg Hx     Social History   Socioeconomic History   Marital status: Single    Spouse name: Not on file   Number of children: 2   Years of education: 12   Highest education level: Not on  file  Occupational History   Not on file  Tobacco Use   Smoking status: Every Day    Packs/day: 1.00    Types: Cigarettes   Smokeless tobacco: Never  Vaping Use   Vaping Use: Never used  Substance and Sexual Activity   Alcohol use: Not Currently   Drug use: Yes    Types: Marijuana    Comment: last use 2 weeks ago    Sexual activity: Not on file  Other Topics Concern   Not on file  Social History Narrative   Not on file   Social Determinants of Health   Financial Resource Strain: Not on file  Food Insecurity: Not on file  Transportation Needs: Not on file  Physical Activity: Not on file  Stress: Not on file   Social Connections: Not on file    ECOG Status: 0 - Asymptomatic  Review of Systems  Review of Systems: A 12 point ROS discussed and pertinent positives are indicated in the HPI above.  All other systems are negative.  Physical Exam No direct physical exam was performed (except for noted visual exam findings with Video Visits).     Vital Signs: There were no vitals taken for this visit.  Imaging: CT ABDOMEN W WO CONTRAST  Result Date: 01/21/2021 CLINICAL DATA:  Follow-up left renal ablation, LEFT renal lesion ablation on 04/04/2020. EXAM: CT ABDOMEN WITHOUT AND WITH CONTRAST TECHNIQUE: Multidetector CT imaging of the abdomen was performed following the standard protocol before and following the bolus administration of intravenous contrast. CONTRAST:  23mL OMNIPAQUE IOHEXOL 300 MG/ML  SOLN COMPARISON:  Outside CT October 07, 2019 and CT January 02, 2020. FINDINGS: Lower chest: No acute abnormality. Hepatobiliary: Round arterially enhancing 7 mm segment VI focus of enhancement, which equilibrates to background liver on subsequent imaging, suggestive of a benign vascular lesion. Gallbladder is unremarkable. No biliary ductal dilation. Pancreas: Within normal limits. Spleen: Within normal limits. Adrenals/Urinary Tract: Bilateral adrenal glands are unremarkable. Hypodense at the left cryoablation site is decreased in volume now measuring 2.8 x 2.0 cm on image 30/6 previously 2.9 x 2.7 cm, without discernible postcontrast enhancement at the treatment site. No hydronephrosis. No solid enhancing renal lesions. Similar bilateral cortical thinning. Stomach/Bowel: The stomach as well as the visualized loops of small-bowel and colon in the abdomen are without evidence of bowel wall thickening, distention or acute inflammation. Colonic diverticulosis without findings of acute diverticulitis. Vascular/Lymphatic: Aortic atherosclerosis without aneurysmal dilation. Vascular calcifications in the bilateral renal  hila. No pathologically enlarged abdominal lymph nodes. Other: No abdominal ascites. Musculoskeletal: Multilevel degenerative changes spine. No aggressive lytic or blastic lesion of bone. IMPRESSION: 1. Interval decreased left cryoablation site tissue without evidence of residual or recurrent disease. 2. Round arterially enhancing 7 mm segment VI focus of enhancement, which equilibrates to background liver on subsequent imaging, suggestive of a benign vascular lesion. 3. Colonic diverticulosis without findings of acute diverticulitis. 4.  Aortic Atherosclerosis (ICD10-I70.0). Electronically Signed   By: Dahlia Bailiff MD   On: 01/21/2021 08:13    Labs:  CBC: Recent Labs    03/27/20 1500 04/05/20 0608  WBC 9.8 16.0*  HGB 12.4* 12.0*  HCT 39.6 37.6*  PLT 511* 430*    COAGS: Recent Labs    03/27/20 1500  INR 0.9    BMP: Recent Labs    03/27/20 1500 04/05/20 0608 08/03/20 0733 01/18/21 1657  NA 139 137  --   --   K 3.7 3.6  --   --  CL 105 104  --   --   CO2 26 24  --   --   GLUCOSE 98 108*  --   --   BUN 14 15  --   --   CALCIUM 9.0 8.6*  --   --   CREATININE 1.19 1.30* 1.20 1.70*  GFRNONAA 59* 53*  --   --   GFRAA >60 >60  --   --     LIVER FUNCTION TESTS: No results for input(s): BILITOT, AST, ALT, ALKPHOS, PROT, ALBUMIN in the last 8760 hours.  TUMOR MARKERS: No results for input(s): AFPTM, CEA, CA199, CHROMGRNA in the last 8760 hours.  Assessment and Plan:  My impression is that Mr. Tippin is doing very well after cryoablation of his left renal cell carcinoma.  The most recent CT shows no residual/recurrent tumor, and the expected involution of the ablation site without complicating features. We reviewed the CT findings and implications.  We reviewed the importance of continued surveillance until stability x5 years is confirmed.  He seemed to understand and did ask appropriate questions which were answered. I did recommend he reach out to Dr. Alan Ripper office if  he has continued concerns regarding his urinary hesitancy and flow which may be prostate related. We will plan to get a follow-up CT in about 6 months from now, and assuming that looks good we can stretch out the intervals until stability x5 years confirmed.  Thank you for this interesting consult.  I greatly enjoyed meeting Jawaan Adachi and look forward to participating in their care.  A copy of this report was sent to the requesting provider on this date.  Electronically Signed: Rickard Rhymes 02/14/2021, 8:38 AM   I spent a total of    15 Minutes in remote  clinical consultation, greater than 50% of which was counseling/coordinating care for renal cell carcinoma, post ablation.    Visit type: Audio only (telephone). Audio (no video) only due to patient's lack of internet/smartphone capability. Alternative for in-person consultation at [facility], [address] [place] [place], [place], [place]. This visit type was conducted due to national recommendations for restrictions regarding the COVID-19 Pandemic (e.g. social distancing).  This format is felt to be most appropriate for this patient at this time.  All issues noted in this document were discussed and addressed.

## 2021-03-04 ENCOUNTER — Emergency Department (HOSPITAL_COMMUNITY): Payer: Medicare Other

## 2021-03-04 ENCOUNTER — Other Ambulatory Visit: Payer: Self-pay

## 2021-03-04 ENCOUNTER — Emergency Department (HOSPITAL_COMMUNITY)
Admission: EM | Admit: 2021-03-04 | Discharge: 2021-03-04 | Disposition: A | Discharge status: Home / Self Care | Payer: Medicare Other | Attending: Emergency Medicine | Admitting: Emergency Medicine

## 2021-03-04 ENCOUNTER — Encounter (HOSPITAL_COMMUNITY): Payer: Self-pay | Admitting: *Deleted

## 2021-03-04 DIAGNOSIS — R630 Anorexia: Secondary | ICD-10-CM | POA: Insufficient documentation

## 2021-03-04 DIAGNOSIS — R42 Dizziness and giddiness: Secondary | ICD-10-CM

## 2021-03-04 DIAGNOSIS — R000 Tachycardia, unspecified: Secondary | ICD-10-CM | POA: Diagnosis not present

## 2021-03-04 DIAGNOSIS — Z20822 Contact with and (suspected) exposure to covid-19: Secondary | ICD-10-CM | POA: Insufficient documentation

## 2021-03-04 DIAGNOSIS — Z79899 Other long term (current) drug therapy: Secondary | ICD-10-CM | POA: Insufficient documentation

## 2021-03-04 DIAGNOSIS — F17210 Nicotine dependence, cigarettes, uncomplicated: Secondary | ICD-10-CM | POA: Diagnosis not present

## 2021-03-04 DIAGNOSIS — R109 Unspecified abdominal pain: Secondary | ICD-10-CM | POA: Diagnosis not present

## 2021-03-04 DIAGNOSIS — I10 Essential (primary) hypertension: Secondary | ICD-10-CM | POA: Insufficient documentation

## 2021-03-04 DIAGNOSIS — E860 Dehydration: Secondary | ICD-10-CM

## 2021-03-04 DIAGNOSIS — R5383 Other fatigue: Secondary | ICD-10-CM | POA: Diagnosis present

## 2021-03-04 LAB — COMPREHENSIVE METABOLIC PANEL
ALT: 15 U/L (ref 0–44)
AST: 19 U/L (ref 15–41)
Albumin: 4.3 g/dL (ref 3.5–5.0)
Alkaline Phosphatase: 77 U/L (ref 38–126)
Anion gap: 10 (ref 5–15)
BUN: 21 mg/dL (ref 8–23)
CO2: 25 mmol/L (ref 22–32)
Calcium: 9.9 mg/dL (ref 8.9–10.3)
Chloride: 102 mmol/L (ref 98–111)
Creatinine, Ser: 1.6 mg/dL — ABNORMAL HIGH (ref 0.61–1.24)
GFR, Estimated: 44 mL/min — ABNORMAL LOW (ref >60)
Glucose, Bld: 117 mg/dL — ABNORMAL HIGH (ref 70–99)
Potassium: 3.7 mmol/L (ref 3.5–5.1)
Sodium: 137 mmol/L (ref 135–145)
Total Bilirubin: 0.6 mg/dL (ref 0.3–1.2)
Total Protein: 8 g/dL (ref 6.5–8.1)

## 2021-03-04 LAB — TROPONIN I (HIGH SENSITIVITY)
Troponin I (High Sensitivity): 8 ng/L (ref <18)
Troponin I (High Sensitivity): 9 ng/L (ref <18)

## 2021-03-04 LAB — CBC WITH DIFFERENTIAL/PLATELET
Abs Immature Granulocytes: 0.03 10*3/uL (ref 0.00–0.07)
Basophils Absolute: 0.1 10*3/uL (ref 0.0–0.1)
Basophils Relative: 1 %
Eosinophils Absolute: 0.2 10*3/uL (ref 0.0–0.5)
Eosinophils Relative: 1 %
HCT: 40.5 % (ref 39.0–52.0)
Hemoglobin: 13.3 g/dL (ref 13.0–17.0)
Immature Granulocytes: 0 %
Lymphocytes Relative: 15 %
Lymphs Abs: 1.7 10*3/uL (ref 0.7–4.0)
MCH: 25.7 pg — ABNORMAL LOW (ref 26.0–34.0)
MCHC: 32.8 g/dL (ref 30.0–36.0)
MCV: 78.2 fL — ABNORMAL LOW (ref 80.0–100.0)
Monocytes Absolute: 0.9 10*3/uL (ref 0.1–1.0)
Monocytes Relative: 8 %
Neutro Abs: 8.1 10*3/uL — ABNORMAL HIGH (ref 1.7–7.7)
Neutrophils Relative %: 75 %
Platelets: 526 10*3/uL — ABNORMAL HIGH (ref 150–400)
RBC: 5.18 MIL/uL (ref 4.22–5.81)
RDW: 16.2 % — ABNORMAL HIGH (ref 11.5–15.5)
WBC: 11 10*3/uL — ABNORMAL HIGH (ref 4.0–10.5)
nRBC: 0 % (ref 0.0–0.2)

## 2021-03-04 LAB — TSH: TSH: 0.345 u[IU]/mL — ABNORMAL LOW (ref 0.350–4.500)

## 2021-03-04 LAB — RESP PANEL BY RT-PCR (FLU A&B, COVID) ARPGX2
Influenza A by PCR: NEGATIVE
Influenza B by PCR: NEGATIVE
SARS Coronavirus 2 by RT PCR: NEGATIVE

## 2021-03-04 LAB — T4, FREE: Free T4: 0.93 ng/dL (ref 0.61–1.12)

## 2021-03-04 LAB — D-DIMER, QUANTITATIVE: D-Dimer, Quant: 0.51 ug/mL-FEU — ABNORMAL HIGH (ref 0.00–0.50)

## 2021-03-04 LAB — LIPASE, BLOOD: Lipase: 40 U/L (ref 11–51)

## 2021-03-04 MED ORDER — MECLIZINE HCL 25 MG PO TABS: 12.5000 mg | ORAL_TABLET | Freq: Two times a day (BID) | ORAL | 0 refills | Status: AC | PRN

## 2021-03-04 MED ORDER — SODIUM CHLORIDE 0.9 % IV BOLUS
1000.0000 mL | Freq: Once | INTRAVENOUS | Status: AC
  Administered 2021-03-04: 1000 mL via INTRAVENOUS

## 2021-03-04 MED ORDER — ONDANSETRON 4 MG PO TBDP: 4.0000 mg | ORAL_TABLET | Freq: Three times a day (TID) | ORAL | 0 refills | Status: AC | PRN

## 2021-03-04 MED ORDER — ONDANSETRON 4 MG PO TBDP: 4.0000 mg | ORAL_TABLET | Freq: Three times a day (TID) | ORAL | 0 refills | Status: DC | PRN

## 2021-03-04 MED ORDER — MECLIZINE HCL 25 MG PO TABS
12.5000 mg | ORAL_TABLET | Freq: Once | ORAL | Status: AC
  Administered 2021-03-04: 12.5 mg via ORAL
  Filled 2021-03-04: qty 1

## 2021-03-04 MED ORDER — MECLIZINE HCL 25 MG PO TABS: 12.5000 mg | ORAL_TABLET | Freq: Two times a day (BID) | ORAL | 0 refills | Status: DC | PRN

## 2021-03-04 MED ORDER — ONDANSETRON HCL 4 MG/2ML IJ SOLN
4.0000 mg | Freq: Once | INTRAMUSCULAR | Status: AC
  Administered 2021-03-04: 4 mg via INTRAVENOUS
  Filled 2021-03-04: qty 2

## 2021-03-04 NOTE — ED Provider Notes (Signed)
[name] Provider Note   CSN: [id number] Arrival date & time: 03/04/21  0847     History Chief Complaint  Patient presents with   Fatigue    George Olsen is a 75 y.o. male.  This is a 75 yo male with history as below presenting to ED with multiple complaints.  Patient reports intermittent abdominal discomfort, poor appetite, fatigue, room spinning sensation over the past 24 to 48 hours.  Denies recent travel or sick contacts.  Denies recent medication changes.  No suspicious oral intake he does report reduced appetite and poor oral intake last 24 hrs. Room spinning sensation worsened when he attempts to ambulate, turns his head. Sensation is sudden in onset and maximal at onset. Improved by sitting still or lying down. No numbness or tingling, no vision changes, no headaches or neck pain. No ear pain or recent trauma. No fevers. No change to bowel or bladder function over last few days. No nausea or emesis. No chest pain, dyspnea.   The history is provided by the patient. No language interpreter was used.      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   Hypertensive retinopathy of both eyes 01/25/2019   Nuclear sclerotic cataract of both eyes 01/25/2019   Posterior vitreous detachment of both eyes [id number]   Folliculitis 99991111   Sebaceous cyst 04/23/2017   Constipation 04/24/2016   Cigarette smoker 03/25/2016   Weight loss 03/25/2016   Abscess 09/11/2015   Male erectile dysfunction, unspecified 01/22/2015    Past Surgical History:  Procedure Laterality Date   COLONOSCOPY  07/12/2009   COLONOSCOPY  09/19/2019   FINGER SURGERY     IR RADIOLOGIST EVAL & MGMT   03/13/2020   IR RADIOLOGIST EVAL & MGMT  08/07/2020   IR RADIOLOGIST EVAL & MGMT  02/14/2021   RADIOLOGY WITH ANESTHESIA Left 04/04/2020   Procedure: CT WITH ANESTHESIA CT CRYOABLATION;  Surgeon: Arne Cleveland, MD;  Location: [facility];  Service: Radiology;  Laterality: Left;       Family History  Problem Relation Age of Onset   Esophageal cancer Neg Hx    Colon polyps Neg Hx    Colon cancer Neg Hx    Rectal cancer Neg Hx    Stomach cancer Neg Hx     Social History   Tobacco Use   Smoking status: Every Day    Packs/day: 1.00    Types: Cigarettes   Smokeless tobacco: Never  Vaping Use   Vaping Use: Never used  Substance Use Topics   Alcohol use: Not Currently   Drug use: Yes    Types: Marijuana    Comment: last use 2 weeks ago     Home Medications Prior to Admission medications   Medication Sig Start Date End Date Taking? Authorizing Provider  allopurinol (ZYLOPRIM) 100 MG tablet Take 100 mg by mouth daily. 07/10/19  Yes [provider]  amLODipine (NORVASC) 10 MG tablet Take 10 mg by mouth daily.   Yes [provider]  benazepril (LOTENSIN) 40 MG tablet Take 40 mg by mouth daily.    Yes [provider]  chlorthalidone (HYGROTON) 25  MG tablet Take 25 mg by mouth daily. 12/31/20  Yes [provider]  Multiple Vitamins-Minerals (CENTRUM SILVER PO) Take 1 tablet by mouth daily.    Yes [provider]  Omega-3 Fatty Acids (FISH OIL PO) Take 1 capsule by mouth daily.   Yes [provider]  tadalafil (CIALIS) 20 MG tablet Take 20 mg by mouth daily as needed for erectile dysfunction.  01/22/15  Yes [provider]  escitalopram (LEXAPRO) 10 MG tablet Take 1 tablet (10 mg total) by mouth daily. 11/12/20 12/12/20  Ajibola, Rosezella Florida A, NP  meclizine (ANTIVERT) 25 MG tablet Take 0.5 tablets (12.5 mg total) by mouth 2 (two) times daily as needed for dizziness. 03/04/21   Jeanell Sparrow, DO  ondansetron (ZOFRAN ODT) 4 MG disintegrating  tablet Take 1 tablet (4 mg total) by mouth every 8 (eight) hours as needed for up to 8 doses for nausea or vomiting. 03/04/21   Jeanell Sparrow, DO    Allergies    Patient has no known allergies.  Review of Systems   Review of Systems  Constitutional:  Positive for appetite change and fatigue. Negative for chills and fever.  HENT:  Negative for facial swelling and trouble swallowing.   Eyes:  Negative for photophobia and visual disturbance.  Respiratory:  Negative for cough and shortness of breath.   Cardiovascular:  Negative for chest pain and palpitations.  Gastrointestinal:  Positive for abdominal pain. Negative for diarrhea, nausea and vomiting.  Endocrine: Negative for polydipsia and polyuria.  Genitourinary:  Negative for difficulty urinating and hematuria.  Musculoskeletal:  Negative for gait problem and joint swelling.  Skin:  Negative for pallor and rash.  Neurological:  Positive for dizziness. Negative for syncope and headaches.  Psychiatric/Behavioral:  Negative for agitation and confusion.    Physical Exam Updated Vital Signs BP (!) 148/82   Pulse 65   Temp 98.1 F (36.7 C) (Oral)   Resp 18   SpO2 100%   Physical Exam Vitals and nursing note reviewed.  Constitutional:      General: He is not in acute distress.    Appearance: Normal appearance. He is well-developed.  HENT:     Head: Normocephalic and atraumatic.     Right Ear: Tympanic membrane and external ear normal.     Left Ear: Tympanic membrane and external ear normal.     Mouth/Throat:     Mouth: Mucous membranes are moist.  Eyes:     General: No scleral icterus.    Extraocular Movements: Extraocular movements intact.     Right eye: Nystagmus present.     Pupils: Pupils are equal, round, and reactive to light.     Comments: 1-2 beats of horizontal nystagmus  No strabismus    Cardiovascular:     Rate and Rhythm: Regular rhythm. Tachycardia present.     Pulses: Normal pulses.     Heart sounds: Normal  heart sounds.  Pulmonary:     Effort: Pulmonary effort is normal. No respiratory distress.     Breath sounds: Normal breath sounds.  Abdominal:     General: Abdomen is flat.     Palpations: Abdomen is soft.     Tenderness: There is no abdominal tenderness.  Musculoskeletal:        General: Normal range of motion.     Cervical back: Normal range of motion.     Right lower leg: No edema.     Left lower leg: No edema.  Skin:  General: Skin is warm and dry.     Capillary Refill: Capillary refill takes less than 2 seconds.  Neurological:     General: No focal deficit present.     Mental Status: He is alert and oriented to person, place, and time.     GCS: GCS eye subscore is 4. GCS verbal subscore is 5. GCS motor subscore is 6.     Cranial Nerves: Cranial nerves are intact. No dysarthria or facial asymmetry.     Sensory: Sensation is intact.     Motor: Motor function is intact. No tremor.     Coordination: Coordination is intact.     Gait: Gait is intact.  Psychiatric:        Mood and Affect: Mood normal.        Behavior: Behavior normal.    ED Results / Procedures / Treatments   Labs (all labs ordered are listed, but only abnormal results are displayed) Labs Reviewed  CBC WITH DIFFERENTIAL/PLATELET - Abnormal; Notable for the following components:      Result Value   WBC 11.0 (*)    MCV 78.2 (*)    MCH 25.7 (*)    RDW 16.2 (*)    Platelets 526 (*)    Neutro Abs 8.1 (*)    All other components within normal limits  COMPREHENSIVE METABOLIC PANEL - Abnormal; Notable for the following components:   Glucose, Bld 117 (*)    Creatinine, Ser 1.60 (*)    GFR, Estimated 44 (*)    All other components within normal limits  D-DIMER, QUANTITATIVE - Abnormal; Notable for the following components:   D-Dimer, Quant 0.51 (*)    All other components within normal limits  TSH - Abnormal; Notable for the following components:   TSH 0.345 (*)    All other components within normal limits   RESP PANEL BY RT-PCR (FLU A&B, COVID) ARPGX2  LIPASE, BLOOD  T4, FREE  TROPONIN I (HIGH SENSITIVITY)  TROPONIN I (HIGH SENSITIVITY)    EKG EKG Interpretation  Date/Time:  Monday March 04 2021 09:19:09 EDT Ventricular Rate:  99 PR Interval:  144 QRS Duration: 87 QT Interval:  353 QTC Calculation: 453 R Axis:   51 Text Interpretation: Sinus rhythm Biatrial enlargement Probable left ventricular hypertrophy Similar to prior ECG Confirmed by Wynona Dove ([id number]) on 03/04/2021 9:27:40 AM  Radiology DG Chest Portable 1 View  Result Date: 03/04/2021 CLINICAL DATA:  Pt reports he "just doesn't feel good" since yesterday. He has felt tired, lack of appetite. He denies cough, fever, pain. EXAM: PORTABLE CHEST - 1 VIEW COMPARISON:  04/04/2020 FINDINGS: Lungs are clear. Heart size and mediastinal contours are within normal limits. No effusion.  No pneumothorax. Visualized bones unremarkable. IMPRESSION: No acute cardiopulmonary disease. Electronically Signed   By: Lucrezia Europe M.D.   On: 03/04/2021 10:15    Procedures Procedures   Medications Ordered in ED Medications  sodium chloride 0.9 % bolus 1,000 mL (1,000 mLs Intravenous New Bag/Given 03/04/21 0938)  meclizine (ANTIVERT) tablet 12.5 mg (12.5 mg Oral Given 03/04/21 0938)  meclizine (ANTIVERT) tablet 12.5 mg (12.5 mg Oral Given 03/04/21 1219)  sodium chloride 0.9 % bolus 1,000 mL (1,000 mLs Intravenous New Bag/Given 03/04/21 1219)  ondansetron (ZOFRAN) injection 4 mg (4 mg Intravenous Given 03/04/21 1219)    ED Course  I have reviewed the triage vital signs and the nursing notes.  Pertinent labs & imaging results that were available during my care of the patient were reviewed by  me and considered in my medical decision making (see chart for details).  Clinical Course as of 03/04/21 [place] Mar 04, 2021  1123 Patient reports he is feeling better at this time, ambulatory. D/w sister at bedside.  [SG]    Clinical Course User Index [SG] Jeanell Sparrow, DO   MDM Rules/Calculators/A&P                           This Is a 75 year old well-appearing male presenting to the ER for multiple complaints.  Patient with room spinning sensation, fatigue.  His neurologic exam is nonfocal.  Abdomen is soft, nontender, nonperitoneal.  Speaking clearly in full sentences.  Overall well-appearing.  He is tachycardic 104.  Vital signs otherwise stable. Serious etiology considered.  Low risk Wells score, obtain D-dimer. Age adjusted D-dimer is negative. PE is unlikely.   EKG reviewed, demonstrates normal sinus rhythm, likely LVH.  No evidence of acute ischemia, no STEMI.  Labs reviewed; creatinine is elevated 1.6 GFR 44 this is similar to patient's prior lab evaluation.  He has history of renal cell carcinoma.  There is concern for mild dehydration.  Given IV fluids. He is tolerating PO without difficulty.  Patient reports symptoms have improved significantly following IV fluids and medication.  Patient exam is consistent with peripheral vertigo.   Repeat neurologic exam is stable. HINTs exam is consistent with peripheral vertigo. Recommend patient follow-up with ENT.   The patient improved significantly and was discharged in stable condition. Detailed discussions were had with the patient regarding current findings, and need for close f/u with PCP or on call doctor. The patient has been instructed to return immediately if the symptoms worsen in any way for re-evaluation. Patient verbalized understanding and is in agreement with current care plan. All questions answered prior to discharge.    Final Clinical Impression(s) / ED Diagnoses Final diagnoses:  Mild dehydration  Vertigo    Rx / DC Orders ED Discharge Orders          Ordered    meclizine (ANTIVERT) 25 MG tablet  2 times daily PRN,   Status:  Discontinued        03/04/21 1614    ondansetron (ZOFRAN ODT) 4 MG disintegrating tablet  Every 8 hours PRN,   Status:  Discontinued         03/04/21 1614    meclizine (ANTIVERT) 25 MG tablet  2 times daily PRN        03/04/21 1635    ondansetron (ZOFRAN ODT) 4 MG disintegrating tablet  Every 8 hours PRN        03/04/21 1635             Jeanell Sparrow, DO 03/04/21 1637

## 2021-03-04 NOTE — ED Triage Notes (Signed)
Pt reports he "just doesn't feel good" since yesterday. He has felt tired, lack of appetite. He denies cough, fever, pain.

## 2021-08-06 ENCOUNTER — Other Ambulatory Visit: Payer: Self-pay | Admitting: Student

## 2021-08-06 ENCOUNTER — Other Ambulatory Visit: Payer: Self-pay | Admitting: Interventional Radiology

## 2021-08-06 ENCOUNTER — Other Ambulatory Visit: Payer: Self-pay

## 2021-08-06 DIAGNOSIS — N2889 Other specified disorders of kidney and ureter: Secondary | ICD-10-CM

## 2021-08-16 ENCOUNTER — Other Ambulatory Visit: Payer: Self-pay

## 2021-08-16 ENCOUNTER — Encounter (HOSPITAL_COMMUNITY): Payer: Self-pay

## 2021-08-16 ENCOUNTER — Ambulatory Visit (HOSPITAL_COMMUNITY)
Admission: RE | Admit: 2021-08-16 | Discharge: 2021-08-16 | Disposition: A | Discharge status: Home / Self Care | Payer: Medicare Other | Source: Ambulatory Visit | Attending: Interventional Radiology | Admitting: Interventional Radiology

## 2021-08-16 DIAGNOSIS — N2889 Other specified disorders of kidney and ureter: Secondary | ICD-10-CM | POA: Insufficient documentation

## 2021-08-16 LAB — POCT I-STAT CREATININE: Creatinine, Ser: 2.6 mg/dL — ABNORMAL HIGH (ref 0.61–1.24)

## 2021-08-16 MED ORDER — IOHEXOL 350 MG/ML SOLN
100.0000 mL | Freq: Once | INTRAVENOUS | Status: AC | PRN
  Administered 2021-08-16: 100 mL via INTRAVENOUS

## 2021-08-19 ENCOUNTER — Other Ambulatory Visit: Payer: Self-pay | Admitting: Interventional Radiology

## 2021-08-19 ENCOUNTER — Telehealth: Payer: Self-pay | Admitting: *Deleted

## 2021-08-19 DIAGNOSIS — N2889 Other specified disorders of kidney and ureter: Secondary | ICD-10-CM

## 2021-08-19 NOTE — Telephone Encounter (Signed)
At the request of Dr. Vernard Gambles I called Dr. Diona Fanti ofc and made them aware we cancelled the Ct scan, Dr. Vernard Gambles ordered due to labs creat 2.60.  Ofc aware and will send msg to Dr. Diona Fanti. I have also got the patient scheduled for Mri./vm

## 2021-08-20 ENCOUNTER — Telehealth: Payer: Medicare Other

## 2021-08-26 ENCOUNTER — Ambulatory Visit (HOSPITAL_COMMUNITY)
Admission: RE | Admit: 2021-08-26 | Discharge: 2021-08-26 | Disposition: A | Discharge status: Home / Self Care | Payer: Medicare Other | Source: Ambulatory Visit | Attending: Interventional Radiology | Admitting: Interventional Radiology

## 2021-08-26 DIAGNOSIS — N2889 Other specified disorders of kidney and ureter: Secondary | ICD-10-CM | POA: Diagnosis not present

## 2021-08-26 MED ORDER — GADOBUTROL 1 MMOL/ML IV SOLN
7.5000 mL | Freq: Once | INTRAVENOUS | Status: AC | PRN
  Administered 2021-08-26: 7.5 mL via INTRAVENOUS

## 2021-09-09 ENCOUNTER — Ambulatory Visit
Admission: RE | Admit: 2021-09-09 | Discharge: 2021-09-09 | Disposition: A | Discharge status: Home / Self Care | Payer: Medicare Other | Source: Ambulatory Visit | Attending: Interventional Radiology | Admitting: Interventional Radiology

## 2021-09-09 ENCOUNTER — Other Ambulatory Visit: Payer: Self-pay

## 2021-09-09 ENCOUNTER — Encounter: Payer: Self-pay | Admitting: *Deleted

## 2021-09-09 DIAGNOSIS — N2889 Other specified disorders of kidney and ureter: Secondary | ICD-10-CM

## 2021-09-09 HISTORY — PX: IR RADIOLOGIST EVAL & MGMT: IMG5224

## 2021-09-09 NOTE — Progress Notes (Addendum)
Patient ID: George Olsen, male   DOB: 04-28-1946, 76 y.o.   MRN: [id number]       Chief Complaint: Patient was consulted remotely today ([facility]) for left renal cell carcinoma post cryoablation at the request of George Olsen.    Referring Physician(s): Franchot Gallo MD   History of Present Illness: George Olsen is a 76 y.o. male  who on  10/07/2019 obtained CT at the [facility] for indeterminate reason, and left renal mass incidentally noted. No previous imaging available. No hematuria or flank pain. No history of renal insufficiency or dialysis. No anticoagulant use. 04/04/2020 underwent CT-guided core biopsy and ablation of the left renal lesion.  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 He did well postprocedure with only minimal flank pain, discharged the following day as scheduled.  No left flank pain.  No   hematuria.  Renal function stable. 08/03/2020 CT abdomen without/with contrast demonstrates ablation defect, no residual or recurrent enhancing tumor evident, no unexpected findings.    01/18/2021 CT abdomen demonstrates decreased left cryoablation site, no residual or recurrent disease.  Nonspecific 7 mm hepatic segment 6 lesion. 08/16/2021 presented for CT abdomen but i-STAT creatinine was elevated 2.6 (from 1.6) , so scan was rescheduled as MR 08/26/2021 underwent MR abdomen without and with contrast.  Patient has been doing well since then.  No flank pain or hematuria.     He is otherwise doing well, working.   Past Medical History:  Diagnosis Date   Constipation    Depression    Gout    Hypertension    left renal ca dx'd 03/2020   Smoker     Past Surgical History:  Procedure Laterality Date   COLONOSCOPY  07/12/2009   COLONOSCOPY  09/19/2019   FINGER SURGERY     IR RADIOLOGIST EVAL & MGMT  03/13/2020   IR RADIOLOGIST EVAL & MGMT  08/07/2020   IR RADIOLOGIST EVAL & MGMT  02/14/2021   RADIOLOGY WITH ANESTHESIA Left 04/04/2020   Procedure:  CT WITH ANESTHESIA CT CRYOABLATION;  Surgeon: Arne Cleveland, MD;  Location: [facility];  Service: Radiology;  Laterality: Left;    Allergies: Patient has no known allergies.  Medications: Prior to Admission medications   Medication Sig Start Date End Date Taking? Authorizing Provider  allopurinol (ZYLOPRIM) 100 MG tablet Take 100 mg by mouth daily. 07/10/19   [provider]  amLODipine (NORVASC) 10 MG tablet Take 10 mg by mouth daily.    [provider]  benazepril (LOTENSIN) 40 MG tablet Take 40 mg by mouth daily.     [provider]  chlorthalidone (HYGROTON) 25 MG tablet Take 25 mg by mouth daily. 12/31/20   [provider]  escitalopram (LEXAPRO) 10 MG tablet Take 1 tablet (10 mg total) by mouth daily. 11/12/20 12/12/20  Ajibola, Rosezella Florida A, NP  meclizine (ANTIVERT) 25 MG tablet Take 0.5 tablets (12.5 mg total) by mouth 2 (two) times daily as needed for dizziness. 03/04/21   Jeanell Sparrow, DO  Multiple Vitamins-Minerals (CENTRUM SILVER PO) Take 1 tablet by mouth daily.     [provider]  Omega-3 Fatty Acids (FISH OIL PO) Take 1 capsule by mouth daily.    [provider]  ondansetron (ZOFRAN ODT) 4 MG disintegrating tablet Take 1 tablet (4 mg total) by mouth every 8 (eight) hours as needed for up to 8 doses for nausea or vomiting. 03/04/21   Jeanell Sparrow, DO  tadalafil (CIALIS) 20 MG tablet Take 20  mg by mouth daily as needed for erectile dysfunction.  01/22/15   [provider]     Family History  Problem Relation Age of Onset   Esophageal cancer Neg Hx    Colon polyps Neg Hx    Colon cancer Neg Hx    Rectal cancer Neg Hx    Stomach cancer Neg Hx     Social History   Socioeconomic History   Marital status: Single    Spouse name: Not on file   Number of children: 2   Years of education: 12   Highest education level: Not on file  Occupational History   Not on file  Tobacco Use   Smoking status: Every Day    Packs/day:  1.00    Types: Cigarettes   Smokeless tobacco: Never  Vaping Use   Vaping Use: Never used  Substance and Sexual Activity   Alcohol use: Not Currently   Drug use: Yes    Types: Marijuana    Comment: last use 2 weeks ago    Sexual activity: Not on file  Other Topics Concern   Not on file  Social History Narrative   Not on file   Social Determinants of Health   Financial Resource Strain: Not on file  Food Insecurity: Not on file  Transportation Needs: Not on file  Physical Activity: Not on file  Stress: Not on file  Social Connections: Not on file    ECOG Status: 0 - Asymptomatic  Review of Systems  Review of Systems: A 12 point ROS discussed and pertinent positives are indicated in the HPI above.  All other systems are negative.  Physical Exam No direct physical exam was performed (except for noted visual exam findings with Video Visits).     Vital Signs: There were no vitals taken for this visit.  Imaging: MR ABDOMEN WWO CONTRAST  Result Date: 08/28/2021 CLINICAL DATA:  Left renal mass status post cryoablation 04/04/2020. EXAM: MRI ABDOMEN WITHOUT AND WITH CONTRAST TECHNIQUE: Multiplanar multisequence MR imaging of the abdomen was performed both before and after the administration of intravenous contrast. CONTRAST:  7.4mL GADAVIST GADOBUTROL 1 MMOL/ML IV SOLN COMPARISON:  CT scan 01/18/2021 FINDINGS: Lower chest: Unremarkable. Hepatobiliary: As noted on the previous CT, there is a tiny focus of arterial phase hyperenhancement in the inferior right liver (segment VI). This lesion is isointense to background liver on all precontrast post sequences and washes out to background liver parenchymal levels on later post-contrast imaging. Liver parenchyma otherwise unremarkable. There is no evidence for gallstones, gallbladder wall thickening, or pericholecystic fluid. Changes of adenomyomatosis noted at the gallbladder fundus. Common bile duct measures 6 mm diameter in the head of the  pancreas, normal for patient age. Pancreas: No focal mass lesion. No dilatation of the main duct. No intraparenchymal cyst. No peripancreatic edema. Spleen:  No splenomegaly. No focal mass lesion. Adrenals/Urinary Tract: No adrenal nodule or mass. Right kidney unremarkable. 1.6 x 1.6 cm subcapsular focus of heterogeneous T1 shortening identified in the upper interpolar left kidney laterally compatible with proteinaceous debris/granulation from ablation defect. After IV contrast administration, there is no enhancement identified in the scarring although a rim of hypoenhancement is identified in kidney around the central ablation scar. No evidence for restricted diffusion and no unexpected or suspicious enhancement in the ablation bed to suggest local recurrence. Stomach/Bowel: Stomach is unremarkable. No gastric wall thickening. No evidence of outlet obstruction. Duodenum is normally positioned as is the ligament of Treitz. No small bowel or colonic  dilatation within the visualized abdomen. Vascular/Lymphatic: No abdominal aortic aneurysm. No abdominal aortic atherosclerotic calcification. There is no gastrohepatic or hepatoduodenal ligament lymphadenopathy. No retroperitoneal or mesenteric lymphadenopathy. Other:  No intraperitoneal free fluid. Musculoskeletal: No focal suspicious marrow enhancement within the visualized bony anatomy. IMPRESSION: 1. Stable appearance of the ablation bed in the upper interpolar left kidney. No findings to suggest local recurrence or metastatic disease in the abdomen. 2. Tiny focus of arterial phase hyperenhancement in the inferior right liver (segment VI) is stable since prior CT of 01/18/2021. This is most likely benign and may reflect a tiny vascular malformation or FNH. Electronically Signed   By: Misty Stanley M.D.   On: 08/28/2021 05:33    Labs:  CBC: Recent Labs    03/04/21 0929  WBC 11.0*  HGB 13.3  HCT 40.5  PLT 526*    COAGS: No results for input(s): INR, APTT  in the last 8760 hours.  BMP: Recent Labs    01/18/21 1657 03/04/21 0929 08/16/21 1620  NA  --  137  --   K  --  3.7  --   CL  --  102  --   CO2  --  25  --   GLUCOSE  --  117*  --   BUN  --  21  --   CALCIUM  --  9.9  --   CREATININE 1.70* 1.60* 2.60*  GFRNONAA  --  44*  --     LIVER FUNCTION TESTS: Recent Labs    03/04/21 0929  BILITOT 0.6  AST 19  ALT 15  ALKPHOS 77  PROT 8.0  ALBUMIN 4.3    TUMOR MARKERS: No results for input(s): AFPTM, CEA, CA199, CHROMGRNA in the last 8760 hours.  Assessment and Plan:  My impression is that his most recent MR looks great after left renal cell carcinoma cryoablation on 04/04/2020.  No residual or recurrent disease.  No regional adenopathy.  We will continue with routine surveillance, next scan MR without and with contrast in 1 year.  I will see or talk to him after that.  He knows to call in the interval with any questions or problems.  His elevated creatinine is unexpected.  He needs to talk to his PCP about possible nephrology consult.    Thank you for this interesting consult.  I greatly enjoyed meeting George Olsen and look forward to participating in their care.  A copy of this report was sent to the requesting provider and PCP on this date.  Electronically Signed: Rickard Rhymes 09/09/2021, 12:13 PM   I spent a total of   15 Minutes in remote  clinical consultation, greater than 50% of which was counseling/coordinating care for left renal cell carcinoma, post cryoablation.    Visit type: Audio only (telephone). Audio (no video) only due to patient's lack of internet/smartphone capability. Alternative for in-person consultation at [facility], [place] [address], [place], [place]. This visit type was conducted due to national recommendations for restrictions regarding the COVID-19 Pandemic (e.g. social distancing).  This format is felt to be most appropriate for this patient at this time.  All issues noted in this  document were discussed and addressed.

## 2022-03-19 IMAGING — CT CT GUIDANCE TISSUE ABLATION
4 of 7 series · 13 of 34 positions shown, 14 images · non-contrast
Comparison: none

INDICATION: Left renal mass

[Series 2: i-spiral 2.0 bf37 · axial · 0.97mm/px · z∈[+1301,+1397]mm · 4 of 96 slices shown, 5 images (1 of 4)]
[im 24/96  mediastinal]
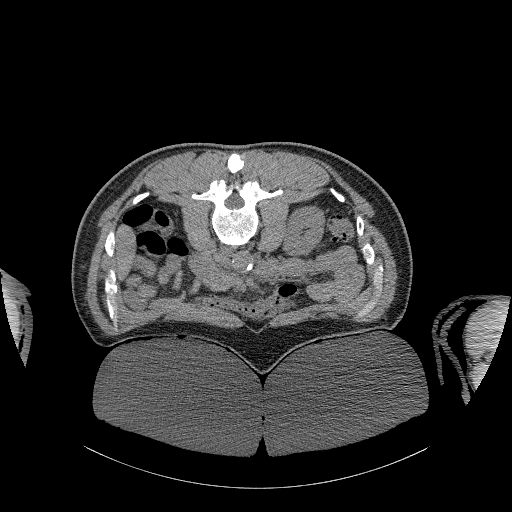
[im 24/96  lung]
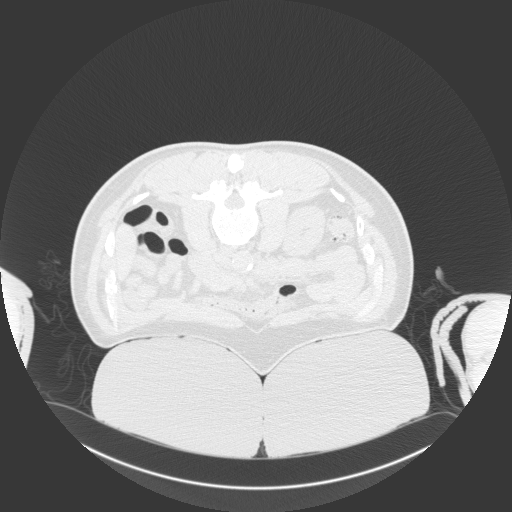
[im 44/96  lung]
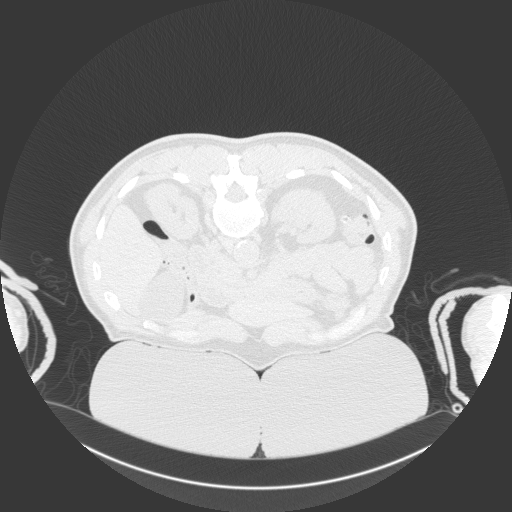
[im 48/96  lung]
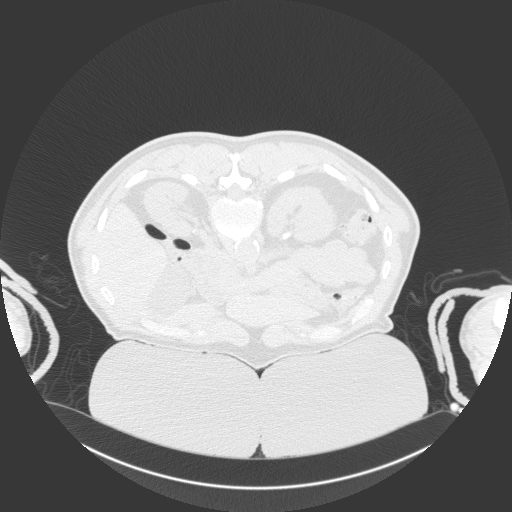
[im 72/96  lung]
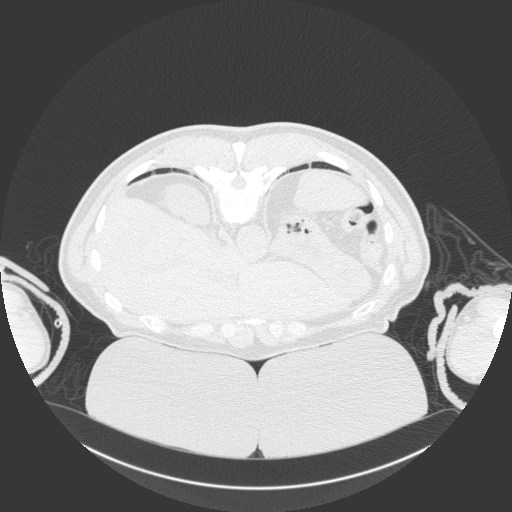

[Series 3: i-spiral 2.0 bf37 · axial · 0.97mm/px · z∈[+1324,+1372]mm · 3 of 74 slices shown (2 of 4)]
[im 25/74  lung]
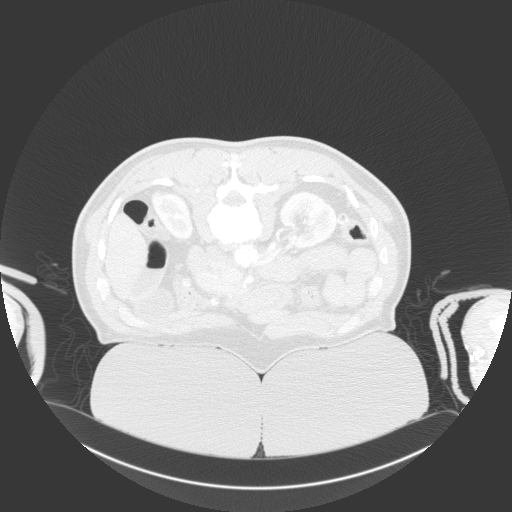
[im 34/74  lung]
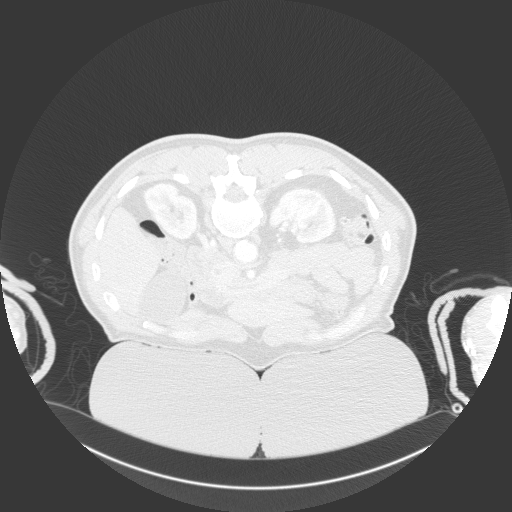
[im 49/74  lung]
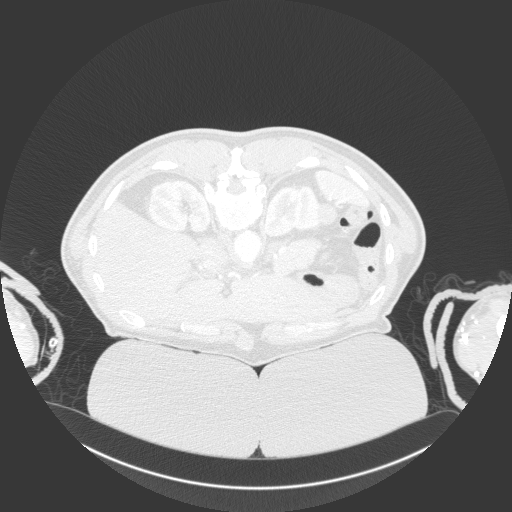

[Series 7: i-spiral 2.0 bf37 · axial · 0.97mm/px · z∈[+1341,+1393]mm · 3 of 74 slices shown (3 of 4)]
[im 23/74  lung]
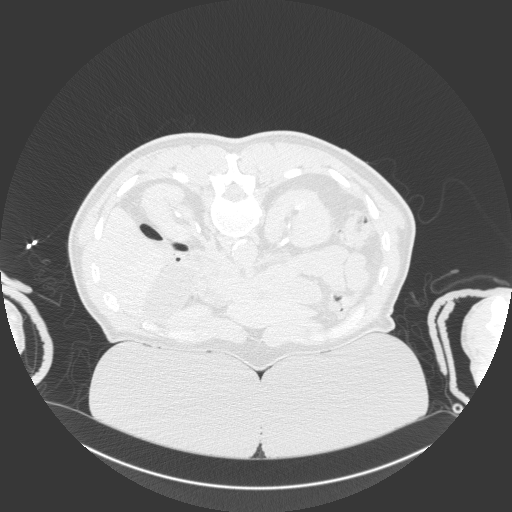
[im 25/74  lung]
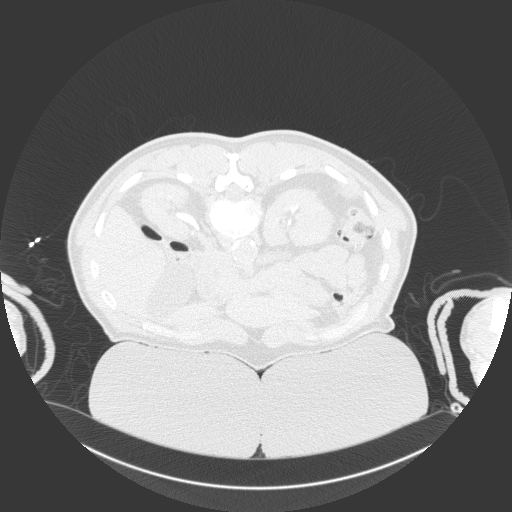
[im 49/74  lung]
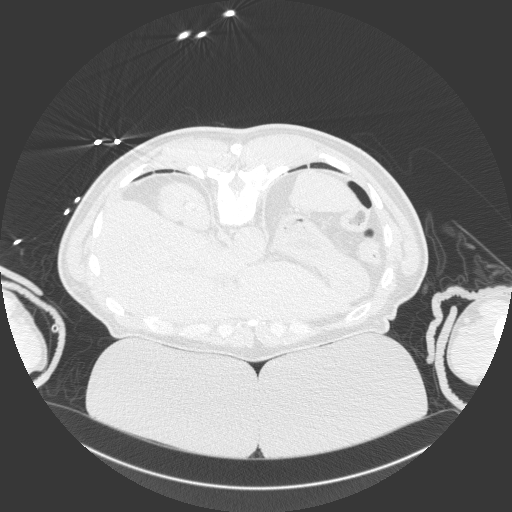

[Series 10: i-spiral 2.0 bf37 · axial · 0.97mm/px · z∈[+1330,+1378]mm · 3 of 74 slices shown (4 of 4)]
[im 25/74  lung]
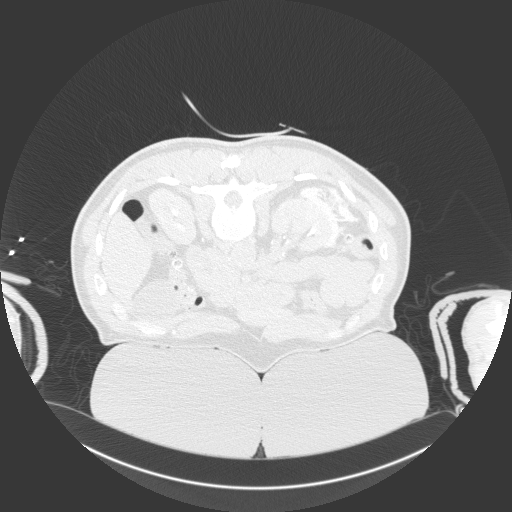
[im 31/74  lung]
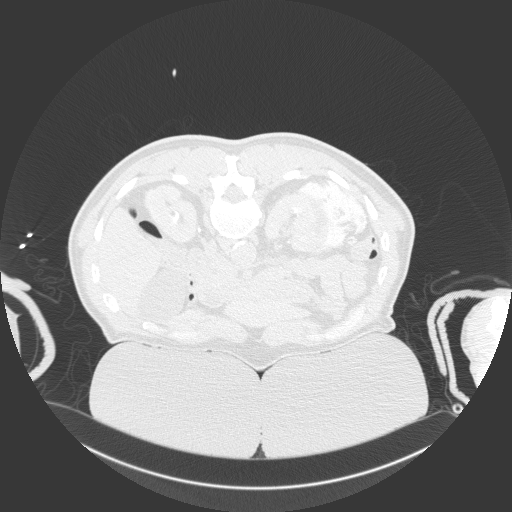
[im 49/74  lung]
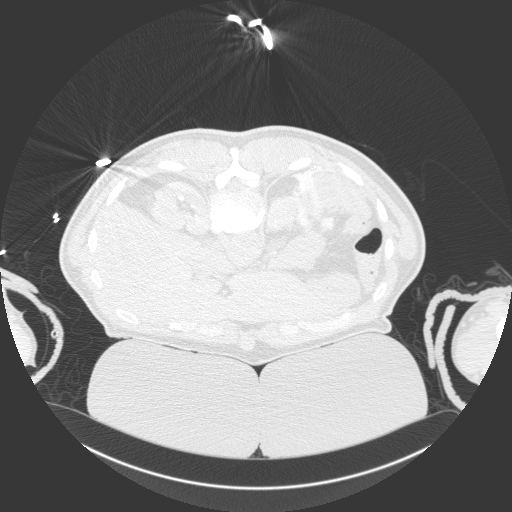

[13 of 34 positions shown; findings below may reference images not displayed]

EXAM:
CT-GUIDED CORE BIOPSY LEFT RENAL MASS

CT-GUIDED PERCUTANEOUS CRYOABLATION OF LEFT RENAL MASS

ANESTHESIA/SEDATION:
General

MEDICATIONS:
Cefazolin 2 g IV the antibiotic was administered in an appropriate
time interval prior to needle puncture of the skin.

CONTRAST:  75 mL Omnipaque 300 IV

PROCEDURE:
The procedure, risks, benefits, and alternatives were explained to
the patient. Questions regarding the procedure were encouraged and
answered. The patient understands and consents to the procedure.

The patient was placed under general anesthesia. Initial unenhanced
CT was performed in a prone position to localize the left renal
mass. After IV contrast administration, additional scanning was
performed. Appropriate skin entry sites determined and marked.

The patient was prepped with chlorhexidine in a sterile fashion, and
a sterile drape was applied covering the operative field. A sterile
gown and sterile gloves were used for the procedure.

Under CT guidance, 3 percutaneous cryoablation probes were advanced
into the left renal mass. Probe positioning was confirmed by CT
prior to cryoablation.

A 17 gauge trocar needle was advanced to the margin of the mass and
coaxial 18 gauge core biopsy samples x3 obtained. These were placed
in formalin and submitted to surgical pathology. The guide needle
was removed.

A 22 gauge Chiba needle was advanced into the left retroperitoneal
space between the kidney and spleen for hydro dissection using 180
mL of [DATE] diluted contrast.

Cryoablation was performed through the 3 probes. Initial 10 minute
cycle of cryoablation was performed. During ablation, periodic CT
imaging was performed to monitor ice ball formation and morphology.
This was followed by a 8 minute thaw cycle. A second 10 minute cycle
of cryoablation was then performed. There are after active thaw, the
cryoablation probe was/probes were removed.

Post-procedural CT was performed.

COMPLICATIONS:
None immediate.
FINDINGS: On localizing scan, upper pole left renal mass was again localized.
Margins were better defined with contrast administration. Little
significant change from previous outside CT 10/07/2019. After
cryoablation probe placement, core biopsy samples obtained.
Subsequently cryoablation x2 cycles was performed as above.The
patient tolerated the procedure well.
IMPRESSION: 1. CT guided percutaneous core cryoablation of left renal mass. The
patient will be observed overnight. Initial follow-up will be
performed in approximately 4 weeks.
2. CT-guided core biopsy, left renal mass.

## 2022-05-26 ENCOUNTER — Other Ambulatory Visit (HOSPITAL_COMMUNITY): Payer: Self-pay | Admitting: Pain Medicine

## 2022-05-26 DIAGNOSIS — E0590 Thyrotoxicosis, unspecified without thyrotoxic crisis or storm: Secondary | ICD-10-CM

## 2022-06-05 ENCOUNTER — Encounter (HOSPITAL_COMMUNITY)
Admission: RE | Admit: 2022-06-05 | Discharge: 2022-06-05 | Disposition: A | Discharge status: Home / Self Care | Payer: Medicare Other | Source: Ambulatory Visit | Attending: Pain Medicine | Admitting: Pain Medicine

## 2022-06-05 DIAGNOSIS — E0590 Thyrotoxicosis, unspecified without thyrotoxic crisis or storm: Secondary | ICD-10-CM | POA: Diagnosis present

## 2022-06-05 MED ORDER — SODIUM IODIDE I-123 7.4 MBQ CAPS
449.0000 | ORAL_CAPSULE | Freq: Once | ORAL | Status: AC
  Administered 2022-06-05: 449 via ORAL

## 2022-06-06 ENCOUNTER — Encounter (HOSPITAL_COMMUNITY)
Admission: RE | Admit: 2022-06-06 | Discharge: 2022-06-06 | Disposition: A | Discharge status: Home / Self Care | Payer: Medicare Other | Source: Ambulatory Visit | Attending: Pain Medicine | Admitting: Pain Medicine

## 2022-06-06 ENCOUNTER — Ambulatory Visit (HOSPITAL_COMMUNITY): Payer: Medicare Other

## 2022-06-11 ENCOUNTER — Ambulatory Visit (HOSPITAL_COMMUNITY): Payer: Medicare Other

## 2022-09-03 ENCOUNTER — Other Ambulatory Visit: Payer: Self-pay | Admitting: Interventional Radiology

## 2022-09-03 DIAGNOSIS — N2889 Other specified disorders of kidney and ureter: Secondary | ICD-10-CM

## 2022-09-09 ENCOUNTER — Encounter: Payer: Self-pay | Admitting: Gastroenterology

## 2022-09-22 ENCOUNTER — Ambulatory Visit (HOSPITAL_COMMUNITY)
Admission: RE | Admit: 2022-09-22 | Discharge: 2022-09-22 | Disposition: A | Discharge status: Home / Self Care | Payer: Medicare HMO | Source: Ambulatory Visit | Attending: Interventional Radiology | Admitting: Interventional Radiology

## 2022-09-22 DIAGNOSIS — N2889 Other specified disorders of kidney and ureter: Secondary | ICD-10-CM | POA: Insufficient documentation

## 2022-09-22 MED ORDER — GADOBUTROL 1 MMOL/ML IV SOLN
7.5000 mL | Freq: Once | INTRAVENOUS | Status: AC | PRN
  Administered 2022-09-22: 7.5 mL via INTRAVENOUS

## 2022-10-08 ENCOUNTER — Ambulatory Visit
Admission: RE | Admit: 2022-10-08 | Discharge: 2022-10-08 | Disposition: A | Discharge status: Home / Self Care | Payer: Medicare Other | Source: Ambulatory Visit | Attending: Interventional Radiology | Admitting: Interventional Radiology

## 2022-10-08 DIAGNOSIS — N2889 Other specified disorders of kidney and ureter: Secondary | ICD-10-CM

## 2022-10-28 ENCOUNTER — Ambulatory Visit
Admission: RE | Admit: 2022-10-28 | Discharge: 2022-10-28 | Disposition: A | Discharge status: Home / Self Care | Payer: Medicare Other | Source: Ambulatory Visit | Attending: Interventional Radiology | Admitting: Interventional Radiology

## 2023-03-18 ENCOUNTER — Other Ambulatory Visit (HOSPITAL_COMMUNITY): Payer: Self-pay | Admitting: Pain Medicine

## 2023-03-18 DIAGNOSIS — E0590 Thyrotoxicosis, unspecified without thyrotoxic crisis or storm: Secondary | ICD-10-CM

## 2023-03-26 ENCOUNTER — Encounter (HOSPITAL_COMMUNITY): Admission: RE | Admit: 2023-03-26 | Payer: Medicare HMO | Source: Ambulatory Visit

## 2023-03-26 ENCOUNTER — Encounter (HOSPITAL_COMMUNITY): Payer: Medicare HMO

## 2023-03-27 ENCOUNTER — Encounter (HOSPITAL_COMMUNITY): Payer: Medicare HMO

## 2023-04-02 ENCOUNTER — Encounter (HOSPITAL_COMMUNITY)
Admission: RE | Admit: 2023-04-02 | Discharge: 2023-04-02 | Disposition: A | Discharge status: Home / Self Care | Payer: Medicare HMO | Source: Ambulatory Visit | Attending: Pain Medicine | Admitting: Pain Medicine

## 2023-04-02 DIAGNOSIS — E0590 Thyrotoxicosis, unspecified without thyrotoxic crisis or storm: Secondary | ICD-10-CM | POA: Insufficient documentation

## 2023-04-02 MED ORDER — SODIUM IODIDE I-123 7.4 MBQ CAPS
425.7000 | ORAL_CAPSULE | Freq: Once | ORAL | Status: AC
  Administered 2023-04-02: 425.7 via ORAL

## 2023-04-03 ENCOUNTER — Encounter (HOSPITAL_COMMUNITY)
Admission: RE | Admit: 2023-04-03 | Discharge: 2023-04-03 | Disposition: A | Discharge status: Home / Self Care | Payer: Medicare HMO | Source: Ambulatory Visit | Attending: Pain Medicine | Admitting: Pain Medicine

## 2023-05-12 ENCOUNTER — Emergency Department (HOSPITAL_COMMUNITY): Payer: Medicare HMO

## 2023-05-12 ENCOUNTER — Other Ambulatory Visit: Payer: Self-pay

## 2023-05-12 ENCOUNTER — Encounter (HOSPITAL_COMMUNITY): Payer: Self-pay

## 2023-05-12 ENCOUNTER — Emergency Department (HOSPITAL_COMMUNITY)
Admission: EM | Admit: 2023-05-12 | Discharge: 2023-05-12 | Disposition: A | Discharge status: Home / Self Care | Payer: Medicare HMO

## 2023-05-12 DIAGNOSIS — Z1152 Encounter for screening for COVID-19: Secondary | ICD-10-CM | POA: Diagnosis not present

## 2023-05-12 DIAGNOSIS — R42 Dizziness and giddiness: Secondary | ICD-10-CM | POA: Insufficient documentation

## 2023-05-12 DIAGNOSIS — Z79899 Other long term (current) drug therapy: Secondary | ICD-10-CM | POA: Diagnosis not present

## 2023-05-12 DIAGNOSIS — Z85528 Personal history of other malignant neoplasm of kidney: Secondary | ICD-10-CM | POA: Insufficient documentation

## 2023-05-12 DIAGNOSIS — R410 Disorientation, unspecified: Secondary | ICD-10-CM | POA: Insufficient documentation

## 2023-05-12 DIAGNOSIS — R4182 Altered mental status, unspecified: Secondary | ICD-10-CM | POA: Diagnosis present

## 2023-05-12 DIAGNOSIS — I10 Essential (primary) hypertension: Secondary | ICD-10-CM | POA: Insufficient documentation

## 2023-05-12 LAB — COMPREHENSIVE METABOLIC PANEL
ALT: 18 U/L (ref 0–44)
AST: 22 U/L (ref 15–41)
Albumin: 3.8 g/dL (ref 3.5–5.0)
Alkaline Phosphatase: 76 U/L (ref 38–126)
Anion gap: 11 (ref 5–15)
BUN: 10 mg/dL (ref 8–23)
CO2: 23 mmol/L (ref 22–32)
Calcium: 9.5 mg/dL (ref 8.9–10.3)
Chloride: 107 mmol/L (ref 98–111)
Creatinine, Ser: 1.6 mg/dL — ABNORMAL HIGH (ref 0.61–1.24)
GFR, Estimated: 44 mL/min — ABNORMAL LOW (ref >60)
Glucose, Bld: 83 mg/dL (ref 70–99)
Potassium: 3.9 mmol/L (ref 3.5–5.1)
Sodium: 141 mmol/L (ref 135–145)
Total Bilirubin: 0.8 mg/dL (ref 0.3–1.2)
Total Protein: 7 g/dL (ref 6.5–8.1)

## 2023-05-12 LAB — RESP PANEL BY RT-PCR (RSV, FLU A&B, COVID)  RVPGX2
Influenza A by PCR: NEGATIVE
Influenza B by PCR: NEGATIVE
Resp Syncytial Virus by PCR: NEGATIVE
SARS Coronavirus 2 by RT PCR: NEGATIVE

## 2023-05-12 LAB — URINALYSIS, ROUTINE W REFLEX MICROSCOPIC
Bilirubin Urine: NEGATIVE
Glucose, UA: NEGATIVE mg/dL
Hgb urine dipstick: NEGATIVE
Ketones, ur: 20 mg/dL — AB
Leukocytes,Ua: NEGATIVE
Nitrite: NEGATIVE
Protein, ur: NEGATIVE mg/dL
Specific Gravity, Urine: 1.011 (ref 1.005–1.030)
pH: 5 (ref 5.0–8.0)

## 2023-05-12 LAB — CBC WITH DIFFERENTIAL/PLATELET
Abs Immature Granulocytes: 0.04 10*3/uL (ref 0.00–0.07)
Basophils Absolute: 0.1 10*3/uL (ref 0.0–0.1)
Basophils Relative: 1 %
Eosinophils Absolute: 0.2 10*3/uL (ref 0.0–0.5)
Eosinophils Relative: 2 %
HCT: 46.6 % (ref 39.0–52.0)
Hemoglobin: 15.1 g/dL (ref 13.0–17.0)
Immature Granulocytes: 0 %
Lymphocytes Relative: 17 %
Lymphs Abs: 1.7 10*3/uL (ref 0.7–4.0)
MCH: 26.8 pg (ref 26.0–34.0)
MCHC: 32.4 g/dL (ref 30.0–36.0)
MCV: 82.8 fL (ref 80.0–100.0)
Monocytes Absolute: 0.8 10*3/uL (ref 0.1–1.0)
Monocytes Relative: 8 %
Neutro Abs: 7.2 10*3/uL (ref 1.7–7.7)
Neutrophils Relative %: 72 %
Platelets: 391 10*3/uL (ref 150–400)
RBC: 5.63 MIL/uL (ref 4.22–5.81)
RDW: 16 % — ABNORMAL HIGH (ref 11.5–15.5)
WBC: 10 10*3/uL (ref 4.0–10.5)
nRBC: 0 % (ref 0.0–0.2)

## 2023-05-12 LAB — AMMONIA: Ammonia: 32 umol/L (ref 9–35)

## 2023-05-12 NOTE — Discharge Instructions (Addendum)
Please follow up with your doctor Return to the ER for worsening symptoms

## 2023-05-12 NOTE — ED Triage Notes (Signed)
Pt arrived via [facility]. Pt woke up at 0600 today with AMS. Pt went to [facility], but the wait was too long so he left, but got lost trying to get back home. [facility]. Per EMS, pt is A&Ox3 to self, place and situation. Pt states LKW yesterday at 2200. Per EMS, pt c/o dizziness, lightheadedness and feeling off balance, but had normal gait.

## 2023-05-12 NOTE — ED Notes (Signed)
Augusto Gamble (patient's sister) number is [phone]

## 2023-05-12 NOTE — ED Provider Notes (Addendum)
[place] EMERGENCY DEPARTMENT AT [facility] Provider Note   CSN: [id number] Arrival date & time: 05/12/23  1312     History  Chief Complaint  Patient presents with   Altered Mental Status    George Olsen is a 77 y.o. male.  77 year old male with past medical history of hypertension as well as renal cell carcinoma in remission presenting to the emergency department today with apparent altered mental status and lightheadedness.  The patient apparently went to bed around 10 PM.  He was feeling "off" this morning when he woke up around 6 AM.  The patient initially tried to go to urgent care but there was a long wait so he called his doctors at the [place].  They noted that he seemed a little confused and he was subsequently brought in by medics.  He did get lost on the way home which is abnormal for him.  He is denying any complaints at this time other than some mild lightheadedness.  Denies any associated chest pain.  He denies any headache but was complaining of a headache earlier with medics.  He is alert and oriented x 3 but thinks that the year is 7.  He has no known history of dementia.  Denies starting any new medications.   Altered Mental Status Associated symptoms: light-headedness        Home Medications Prior to Admission medications   Medication Sig Start Date End Date Taking? Authorizing Provider  allopurinol (ZYLOPRIM) 100 MG tablet Take 100 mg by mouth daily. 07/10/19   [provider]  amLODipine (NORVASC) 10 MG tablet Take 10 mg by mouth daily.    [provider]  benazepril (LOTENSIN) 40 MG tablet Take 40 mg by mouth daily.     [provider]  chlorthalidone (HYGROTON) 25 MG tablet Take 25 mg by mouth daily. 12/31/20   [provider]  escitalopram (LEXAPRO) 10 MG tablet Take 1 tablet (10 mg total) by mouth daily. 11/12/20 12/12/20  Ajibola, Gerrianne Scale A, NP  meclizine (ANTIVERT) 25 MG tablet Take 0.5 tablets (12.5 mg total) by  mouth 2 (two) times daily as needed for dizziness. 03/04/21   Sloan Leiter, DO  Multiple Vitamins-Minerals (CENTRUM SILVER PO) Take 1 tablet by mouth daily.     [provider]  Omega-3 Fatty Acids (FISH OIL PO) Take 1 capsule by mouth daily.    [provider]  ondansetron (ZOFRAN ODT) 4 MG disintegrating tablet Take 1 tablet (4 mg total) by mouth every 8 (eight) hours as needed for up to 8 doses for nausea or vomiting. 03/04/21   Sloan Leiter, DO  tadalafil (CIALIS) 20 MG tablet Take 20 mg by mouth daily as needed for erectile dysfunction.  01/22/15   [provider]      Allergies    Patient has no known allergies.    Review of Systems   Review of Systems  Neurological:  Positive for light-headedness.  All other systems reviewed and are negative.   Physical Exam Updated Vital Signs BP (!) 189/83   Pulse 72   Temp 98.4 F (36.9 C) (Oral)   Resp 17   Ht 6' (1.829 m)   Wt 76.7 kg   SpO2 100%   BMI 22.93 kg/m  Physical Exam Vitals and nursing note reviewed.   Gen: NAD Eyes: PERRL, EOMI HEENT: no oropharyngeal swelling Neck: trachea midline, no meningismus Resp: clear to auscultation bilaterally Card: RRR, no murmurs, rubs, or gallops Abd: nontender,  nondistended Extremities: no calf tenderness, no edema Vascular: 2+ radial pulses bilaterally, 2+ DP pulses bilaterally Neuro: Alert and oriented x 3, NIH stroke scale of 0, no dysmetria on finger-to-nose testing Skin: no rashes Psyc: acting appropriately   ED Results / Procedures / Treatments   Labs (all labs ordered are listed, but only abnormal results are displayed) Labs Reviewed  CBC WITH DIFFERENTIAL/PLATELET - Abnormal; Notable for the following components:      Result Value   RDW 16.0 (*)    All other components within normal limits  COMPREHENSIVE METABOLIC PANEL - Abnormal; Notable for the following components:   Creatinine, Ser 1.60 (*)    GFR, Estimated 44 (*)    All other  components within normal limits  RESP PANEL BY RT-PCR (RSV, FLU A&B, COVID)  [id number]  AMMONIA  URINALYSIS, ROUTINE W REFLEX MICROSCOPIC    EKG None  Radiology DG Chest Portable 1 View  Result Date: 05/12/2023 CLINICAL DATA:  Cough. EXAM: PORTABLE CHEST 1 VIEW COMPARISON:  March 04, 2021. FINDINGS: The heart size and mediastinal contours are within normal limits. Both lungs are clear. The visualized skeletal structures are unremarkable. IMPRESSION: No active disease. Electronically Signed   By: Lupita Raider M.D.   On: 05/12/2023 15:45    Procedures Procedures    Medications Ordered in ED Medications - No data to display  ED Course/ Medical Decision Making/ A&P                                 Medical Decision Making 77 year old male with past medical history of hypertension and renal cell carcinoma in remission presenting to the emergency department today with confusion and lightheadedness.  I will further evaluate patient here with basic labs as well as an EKG and chest x-ray to evaluate for arrhythmias, electrolyte abnormalities, pneumonia.  I will also obtain a COVID and flu swab on the patient.  Will obtain a urinalysis here as well as an ammonia level to evaluate for urinary tract infection or hepatic encephalopathy.  Obtain a CT scan of his head to evaluate for any intracranial lesions that could be contributing.  He does not have any findings on exam consistent with meningitis.  His exam is not consistent with encephalitis here.  He does seem to be answering questions appropriately.  This may be due to undiagnosed dementia.  Will reevaluate for ultimate disposition.  The patient's EKG interpreted by me shows a sinus rhythm with a rate of 69 with normal axis, normal intervals, nonspecific ST-T changes with some baseline artifact.  I do not appreciate any significant ST elevations.  The patient initial workup is reassuring.   The patient sister did call and did provide some  further history that the patient was diagnosed with dementia a few months ago.  The certainly does fit with his presentation here today.  I think that with him being at his baseline per his sister and with his labs being unremarkable I think that he may be safely discharged without CT imaging.  He denies any complaints of any urinary symptoms at this time so I think that urinalysis is unnecessary at this time as well.  His sister can come and pick him up so think that he is safe for discharge.  Amount and/or Complexity of Data Reviewed Labs: ordered. Radiology: ordered.           Final Clinical Impression(s) / ED Diagnoses Final  diagnoses:  Confusion    Rx / DC Orders ED Discharge Orders     None         Durwin Glaze, MD 05/12/23 1614    Durwin Glaze, MD 05/12/23 1630

## 2023-05-12 NOTE — ED Notes (Signed)
Pt sister has been called she will be here in 10 minutes.

## 2023-11-17 ENCOUNTER — Other Ambulatory Visit: Payer: Self-pay | Admitting: Interventional Radiology

## 2023-11-17 DIAGNOSIS — N2889 Other specified disorders of kidney and ureter: Secondary | ICD-10-CM

## 2023-12-05 ENCOUNTER — Emergency Department (HOSPITAL_COMMUNITY)

## 2023-12-05 ENCOUNTER — Encounter (HOSPITAL_COMMUNITY): Payer: Self-pay | Admitting: Emergency Medicine

## 2023-12-05 ENCOUNTER — Other Ambulatory Visit: Payer: Self-pay

## 2023-12-05 ENCOUNTER — Emergency Department (HOSPITAL_COMMUNITY)
Admission: EM | Admit: 2023-12-05 | Discharge: 2023-12-05 | Disposition: A | Discharge status: Home / Self Care | Attending: Emergency Medicine | Admitting: Emergency Medicine

## 2023-12-05 DIAGNOSIS — Z87891 Personal history of nicotine dependence: Secondary | ICD-10-CM | POA: Diagnosis not present

## 2023-12-05 DIAGNOSIS — R051 Acute cough: Secondary | ICD-10-CM | POA: Insufficient documentation

## 2023-12-05 DIAGNOSIS — R059 Cough, unspecified: Secondary | ICD-10-CM | POA: Diagnosis present

## 2023-12-05 LAB — CBC
HCT: 45.2 % (ref 39.0–52.0)
Hemoglobin: 14.4 g/dL (ref 13.0–17.0)
MCH: 26.5 pg (ref 26.0–34.0)
MCHC: 31.9 g/dL (ref 30.0–36.0)
MCV: 83.2 fL (ref 80.0–100.0)
Platelets: 419 10*3/uL — ABNORMAL HIGH (ref 150–400)
RBC: 5.43 MIL/uL (ref 4.22–5.81)
RDW: 16.2 % — ABNORMAL HIGH (ref 11.5–15.5)
WBC: 8.9 10*3/uL (ref 4.0–10.5)
nRBC: 0 % (ref 0.0–0.2)

## 2023-12-05 LAB — BASIC METABOLIC PANEL WITH GFR
Anion gap: 9 (ref 5–15)
BUN: 13 mg/dL (ref 8–23)
CO2: 21 mmol/L — ABNORMAL LOW (ref 22–32)
Calcium: 9.1 mg/dL (ref 8.9–10.3)
Chloride: 109 mmol/L (ref 98–111)
Creatinine, Ser: 1.58 mg/dL — ABNORMAL HIGH (ref 0.61–1.24)
GFR, Estimated: 44 mL/min — ABNORMAL LOW (ref >60)
Glucose, Bld: 107 mg/dL — ABNORMAL HIGH (ref 70–99)
Potassium: 3.7 mmol/L (ref 3.5–5.1)
Sodium: 139 mmol/L (ref 135–145)

## 2023-12-05 MED ORDER — PREDNISONE 20 MG PO TABS
60.0000 mg | ORAL_TABLET | Freq: Once | ORAL | Status: AC
  Administered 2023-12-05: 60 mg via ORAL
  Filled 2023-12-05: qty 3

## 2023-12-05 MED ORDER — AZITHROMYCIN 250 MG PO TABS: 250.0000 mg | ORAL_TABLET | Freq: Every day | ORAL | 0 refills | Status: AC

## 2023-12-05 MED ORDER — PREDNISONE 10 MG PO TABS: 20.0000 mg | ORAL_TABLET | Freq: Every day | ORAL | 0 refills | Status: AC

## 2023-12-05 MED ORDER — ALBUTEROL SULFATE HFA 108 (90 BASE) MCG/ACT IN AERS: 1.0000 | INHALATION_SPRAY | Freq: Four times a day (QID) | RESPIRATORY_TRACT | 1 refills | Status: AC | PRN

## 2023-12-05 MED ORDER — AZITHROMYCIN 250 MG PO TABS
500.0000 mg | ORAL_TABLET | Freq: Once | ORAL | Status: AC
  Administered 2023-12-05: 500 mg via ORAL
  Filled 2023-12-05: qty 2

## 2023-12-05 MED ORDER — IPRATROPIUM-ALBUTEROL 0.5-2.5 (3) MG/3ML IN SOLN
3.0000 mL | Freq: Once | RESPIRATORY_TRACT | Status: AC
  Administered 2023-12-05: 3 mL via RESPIRATORY_TRACT
  Filled 2023-12-05: qty 3

## 2023-12-05 NOTE — Discharge Instructions (Signed)
 Return for any problem.  ?

## 2023-12-05 NOTE — ED Notes (Signed)
 Patient transported to X-ray

## 2023-12-05 NOTE — ED Provider Notes (Signed)
 [place] EMERGENCY DEPARTMENT AT [facility] Provider Note   CSN: [id number] Arrival date & time: 12/05/23  0957     History  Chief Complaint  Patient presents with   Shortness of Breath   Cough    George Olsen is a 78 y.o. male.  78 year old male with prior medical history as detailed below presents for evaluation.  Patient with mild cough and shortness of breath x 1 week.  Patient reports that he was given albuterol 1.5 weeks ago for use at home.  He began using it this week.  This provides some improvement in symptoms.  He denies fever.  He denies productive cough.  He denies chest pain.  Patient with multiple years of tobacco/cigarette use.  He denies known diagnosis of COPD emphysema.  The history is provided by the patient.       Home Medications Prior to Admission medications   Medication Sig Start Date End Date Taking? Authorizing Provider  allopurinol  (ZYLOPRIM ) 100 MG tablet Take 100 mg by mouth daily. 07/10/19   [provider]  amLODipine  (NORVASC ) 10 MG tablet Take 10 mg by mouth daily.    [provider]  benazepril  (LOTENSIN ) 40 MG tablet Take 40 mg by mouth daily.     [provider]  chlorthalidone (HYGROTON) 25 MG tablet Take 25 mg by mouth daily. 12/31/20   [provider]  escitalopram  (LEXAPRO ) 10 MG tablet Take 1 tablet (10 mg total) by mouth daily. 11/12/20 12/12/20  Ajibola, Alexandria Ida A, NP  meclizine  (ANTIVERT ) 25 MG tablet Take 0.5 tablets (12.5 mg total) by mouth 2 (two) times daily as needed for dizziness. 03/04/21   Teddi Favors, DO  Multiple Vitamins-Minerals (CENTRUM SILVER PO) Take 1 tablet by mouth daily.     [provider]  Omega-3 Fatty Acids (FISH OIL PO) Take 1 capsule by mouth daily.    [provider]  ondansetron  (ZOFRAN  ODT) 4 MG disintegrating tablet Take 1 tablet (4 mg total) by mouth every 8 (eight) hours as needed for up to 8 doses for nausea or vomiting. 03/04/21   Teddi Favors, DO  tadalafil (CIALIS) 20 MG tablet Take 20 mg by mouth daily as needed for erectile dysfunction.  01/22/15   [provider]      Allergies    Patient has no known allergies.    Review of Systems   Review of Systems  All other systems reviewed and are negative.   Physical Exam Updated Vital Signs BP (!) 153/72   Pulse (!) 50   Temp 98.1 F (36.7 C) (Oral)   Resp 14   Ht 6' (1.829 m)   Wt 71.7 kg   SpO2 98%   BMI 21.43 kg/m  Physical Exam Vitals and nursing note reviewed.  Constitutional:      General: He is not in acute distress.    Appearance: Normal appearance. He is well-developed.  HENT:     Head: Normocephalic and atraumatic.  Eyes:     Conjunctiva/sclera: Conjunctivae normal.     Pupils: Pupils are equal, round, and reactive to light.  Cardiovascular:     Rate and Rhythm: Normal rate and regular rhythm.     Heart sounds: Normal heart sounds.  Pulmonary:     Effort: Pulmonary effort is normal. No respiratory distress.     Breath sounds: Normal breath sounds.  Abdominal:     General: There is no distension.     Palpations: Abdomen is soft.  Tenderness: There is no abdominal tenderness.  Musculoskeletal:        General: No deformity. Normal range of motion.     Cervical back: Normal range of motion and neck supple.  Skin:    General: Skin is warm and dry.  Neurological:     General: No focal deficit present.     Mental Status: He is alert and oriented to person, place, and time.     ED Results / Procedures / Treatments   Labs (all labs ordered are listed, but only abnormal results are displayed) Labs Reviewed  BASIC METABOLIC PANEL WITH GFR - Abnormal; Notable for the following components:      Result Value   CO2 21 (*)    Glucose, Bld 107 (*)    Creatinine, Ser 1.58 (*)    GFR, Estimated 44 (*)    All other components within normal limits  CBC - Abnormal; Notable for the following components:   RDW 16.2 (*)    Platelets 419 (*)     All other components within normal limits    EKG None  Radiology DG Chest 1 View Result Date: 12/05/2023 CLINICAL DATA:  Cough EXAM: CHEST  1 VIEW COMPARISON:  None Available. FINDINGS: Hazy opacity in the left lung base. No pleural effusion or pneumothorax. IMPRESSION: 1. A hazy opacity is present in the left lung base which may represent developing infiltrate. Consider follow-up radiograph to demonstrate resolution. Electronically Signed   By: Reagan Camera M.D.   On: 12/05/2023 12:51    Procedures Procedures    Medications Ordered in ED Medications  ipratropium-albuterol (DUONEB) 0.5-2.5 (3) MG/3ML nebulizer solution 3 mL (3 mLs Nebulization Given 12/05/23 1042)    ED Course/ Medical Decision Making/ A&P                                 Medical Decision Making Amount and/or Complexity of Data Reviewed Labs: ordered. Radiology: ordered.  Risk Prescription drug management.    Medical Screen Complete  This patient presented to the ED with complaint of cough.  This complaint involves an extensive number of treatment options. The initial differential diagnosis includes, but is not limited to, bronchospasm, pneumonia, COPD exacerbation, metabolic abnormality, etc.  This presentation is: Acute, Chronic, Self-Limited, Previously Undiagnosed, Uncertain Prognosis, Complicated, Systemic Symptoms, and Threat to Life/Bodily Function  Patient presents with cough.  Patient with long history of cigarette use.  He does not have an official diagnosis of COPD or emphysema.  Presentation is consistent with likely COPD exacerbation.  Patient feels much improved with DuoNeb treatment.  Imaging obtained is suggestive of possible early infiltrate.  Patient is appropriate for discharge.  He is not hypoxic.  He is breathing comfortably at time of discharge.  Patient to be sent home with both prednisone burst and antibiotics.  He has albuterol at home.  Importance of close follow-up is  stressed.  Strict return precautions given and understood.  Additional history obtained:  External records from outside sources obtained and reviewed including prior ED visits and prior Inpatient records.   Problem List / ED Course:  Cough   Reevaluation:  After the interventions noted above, I reevaluated the patient and found that they have: improved   Disposition:  After consideration of the diagnostic results and the patients response to treatment, I feel that the patent would benefit from close outpatient follow-up.  Final Clinical Impression(s) / ED Diagnoses Final diagnoses:  Acute cough    Rx / DC Orders ED Discharge Orders          Ordered    azithromycin (ZITHROMAX) 250 MG tablet  Daily        12/05/23 1333    predniSONE (DELTASONE) 10 MG tablet  Daily        12/05/23 1333    albuterol (VENTOLIN HFA) 108 (90 Base) MCG/ACT inhaler  Every 6 hours PRN        12/05/23 1333              Burnette Carte, MD 12/05/23 1343

## 2023-12-05 NOTE — ED Notes (Signed)
 Patient Alert and oriented to baseline. Stable and ambulatory to baseline. Patient verbalized understanding of the discharge instructions.  Patient belongings were taken by the patient.

## 2023-12-05 NOTE — ED Triage Notes (Signed)
 Pt reports SOB and cough since Tuesday. Pt prescribed new inhaler for albuterol about 1.5 weeks ago. Began using it this week. Pt states no relief with inhaler. Also taking mucinex for cough. VSS, nad at present.

## 2024-02-15 ENCOUNTER — Ambulatory Visit (HOSPITAL_COMMUNITY)
Admission: RE | Admit: 2024-02-15 | Discharge: 2024-02-15 | Disposition: A | Discharge status: Home / Self Care | Source: Ambulatory Visit | Attending: Interventional Radiology | Admitting: Interventional Radiology

## 2024-02-15 DIAGNOSIS — N2889 Other specified disorders of kidney and ureter: Secondary | ICD-10-CM | POA: Diagnosis present

## 2024-02-15 MED ORDER — GADOBUTROL 1 MMOL/ML IV SOLN
7.0000 mL | Freq: Once | INTRAVENOUS | Status: AC | PRN
  Administered 2024-02-15: 7 mL via INTRAVENOUS

## 2024-02-24 ENCOUNTER — Ambulatory Visit
Admission: RE | Admit: 2024-02-24 | Discharge: 2024-02-24 | Disposition: A | Discharge status: Home / Self Care | Source: Ambulatory Visit | Attending: Interventional Radiology | Admitting: Interventional Radiology

## 2024-02-24 DIAGNOSIS — N2889 Other specified disorders of kidney and ureter: Secondary | ICD-10-CM

## 2024-02-24 HISTORY — PX: IR RADIOLOGIST EVAL & MGMT: IMG5224

## 2024-02-24 NOTE — Progress Notes (Signed)
 Patient ID: George Olsen, male   DOB: 09/05/1945, 78 y.o.   MRN: [id number]       Chief Complaint: Patient was consulted remotely today (TeleHealth) for f/u left renal ablation at the request of Gissell Barra.    Referring Physician(s): Garnette Shack MD   History of Present Illness: George Olsen is a 78 y.o. male   who on  10/07/2019 obtained CT at the [facility] for indeterminate reason, and left renal mass incidentally noted. No previous imaging available. No hematuria or flank pain. No history of renal insufficiency or dialysis. No anticoagulant use. 04/04/2020 underwent CT-guided core biopsy and ablation of the left renal lesion.  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 He did well postprocedure with only minimal flank pain, discharged the following day as scheduled.  No left flank pain.  No   hematuria.  Renal function stable. 08/03/2020 CT abdomen without/with contrast demonstrates ablation defect, no residual or recurrent enhancing tumor evident, no unexpected findings.    01/18/2021 CT abdomen demonstrates decreased left cryoablation site, no residual or recurrent disease.  Nonspecific 7 mm hepatic segment 6 lesion. 08/16/2021 presented for CT abdomen but i-STAT creatinine was elevated 2.6 (from 1.6) , so scan was rescheduled as MR 08/26/2021 underwent MR abdomen without and with contrast showing Stable appearance of the ablation bed in the upper interpolar left kidney. No findings to suggest local recurrence or metastatic disease in the abdomen. Tiny focus of arterial phase hyperenhancement in the inferior right liver (segment VI) is stable since prior CT of 01/18/2021. This is most likely benign and may reflect a tiny vascular malformation or FNH. 09/22/2022 MRI Stable appearance of the ablation site within the upper pole of left kidney. No convincing evidence for residual/recurrent enhancing tumor. Stable appearance of 6 mm enhancing lesion within the  inferior right lobe of liver. This is favored to represent a benign lesion such as a hemangioma.  Unchanged appearance of 5 mm nonenhancing T2 hyperintense structure within lateral segment of left lobe of liver. This is favored to represent a benign cyst or biliary hamartoma. 02/15/2024 MRI Unchanged, nonenhancing ablation site in the peripheral superior pole of the left kidney without evidence of residual or recurrent disease.  No evidence of recurrent or metastatic disease in the abdomen.  He is doing well, no complaints.  Past Medical History:  Diagnosis Date   Constipation    Depression    Gout    Hypertension    left renal ca dx'd 03/2020   Smoker     Past Surgical History:  Procedure Laterality Date   COLONOSCOPY  07/12/2009   COLONOSCOPY  09/19/2019   FINGER SURGERY     IR RADIOLOGIST EVAL & MGMT  03/13/2020   IR RADIOLOGIST EVAL & MGMT  08/07/2020   IR RADIOLOGIST EVAL & MGMT  02/14/2021   IR RADIOLOGIST EVAL & MGMT  09/09/2021   RADIOLOGY WITH ANESTHESIA Left 04/04/2020   Procedure: CT WITH ANESTHESIA CT CRYOABLATION;  Surgeon: Johann Sieving, MD;  Location: [facility];  Service: Radiology;  Laterality: Left;    Allergies: Patient has no known allergies.  Medications: Prior to Admission medications   Medication Sig Start Date End Date Taking? Authorizing Provider  albuterol  (VENTOLIN  HFA) 108 (90 Base) MCG/ACT inhaler Inhale 1-2 puffs into the lungs every 6 (six) hours as needed for wheezing or shortness of breath. 12/05/23   Laurice Maude BROCKS, MD  allopurinol  (ZYLOPRIM ) 100 MG tablet Take 100 mg by mouth daily. 07/10/19  [provider]  amLODipine  (NORVASC ) 10 MG tablet Take 10 mg by mouth daily.    [provider]  azithromycin  (ZITHROMAX ) 250 MG tablet Take 1 tablet (250 mg total) by mouth daily. Take first 2 tablets together, then 1 every day until finished. 12/05/23   Laurice Maude BROCKS, MD  benazepril  (LOTENSIN ) 40 MG tablet Take 40 mg by mouth daily.      [provider]  chlorthalidone (HYGROTON) 25 MG tablet Take 25 mg by mouth daily. 12/31/20   [provider]  escitalopram  (LEXAPRO ) 10 MG tablet Take 1 tablet (10 mg total) by mouth daily. 11/12/20 12/12/20  Ajibola, Kathryne A, NP  meclizine  (ANTIVERT ) 25 MG tablet Take 0.5 tablets (12.5 mg total) by mouth 2 (two) times daily as needed for dizziness. 03/04/21   Elnor Jayson LABOR, DO  Multiple Vitamins-Minerals (CENTRUM SILVER PO) Take 1 tablet by mouth daily.     [provider]  Omega-3 Fatty Acids (FISH OIL PO) Take 1 capsule by mouth daily.    [provider]  ondansetron  (ZOFRAN  ODT) 4 MG disintegrating tablet Take 1 tablet (4 mg total) by mouth every 8 (eight) hours as needed for up to 8 doses for nausea or vomiting. 03/04/21   Elnor Jayson LABOR, DO  tadalafil (CIALIS) 20 MG tablet Take 20 mg by mouth daily as needed for erectile dysfunction.  01/22/15   [provider]     Family History  Problem Relation Age of Onset   Esophageal cancer Neg Hx    Colon polyps Neg Hx    Colon cancer Neg Hx    Rectal cancer Neg Hx    Stomach cancer Neg Hx     Social History   Socioeconomic History   Marital status: Single    Spouse name: Not on file   Number of children: 2   Years of education: 12   Highest education level: Not on file  Occupational History   Not on file  Tobacco Use   Smoking status: Every Day    Current packs/day: 1.00    Types: Cigarettes   Smokeless tobacco: Never  Vaping Use   Vaping status: Never Used  Substance and Sexual Activity   Alcohol use: Not Currently   Drug use: Yes    Types: Marijuana    Comment: last use 2 weeks ago    Sexual activity: Not on file  Other Topics Concern   Not on file  Social History Narrative   Not on file   Social Drivers of Health   Financial Resource Strain: Not on file  Food Insecurity: No Food Insecurity (10/26/2020)   Received [facility]   Hunger Vital Sign    Within the past 12 months,  you worried that your food would run out before you got the money to buy more.: Never true    Within the past 12 months, the food you bought just didn't last and you didn't have money to get more.: Never true  Transportation Needs: Not on file  Physical Activity: Not on file  Stress: Not on file  Social Connections: Unknown (12/05/2021)   Received from [facility]   Social Network    Social Network: Not on file    ECOG Status: 0 - Asymptomatic  Review of Systems  Review of Systems: A 12 point ROS discussed and pertinent positives are indicated in the HPI above.  All other systems are negative.   Physical Exam No direct physical exam was performed (  except for noted visual exam findings with Video Visits).     Vital Signs: There were no vitals taken for this visit.  Imaging: MR ABDOMEN WWO CONTRAST Result Date: 02/15/2024 CLINICAL DATA:  History of left renal cell carcinoma EXAM: MRI ABDOMEN WITHOUT AND WITH CONTRAST TECHNIQUE: Multiplanar multisequence MR imaging of the abdomen was performed both before and after the administration of intravenous contrast. CONTRAST:  7mL GADAVIST  GADOBUTROL  1 MMOL/ML IV SOLN COMPARISON:  09/22/2022 FINDINGS: Lower chest: No acute abnormality. Hepatobiliary: No solid liver abnormality is seen. No gallstones, gallbladder wall thickening, or biliary dilatation. Pancreas: Unremarkable. No pancreatic ductal dilatation or surrounding inflammatory changes. Spleen: Normal in size without significant abnormality. Adrenals/Urinary Tract: Adrenal glands are unremarkable. Unchanged, nonenhancing ablation site in the peripheral superior pole of the left kidney with intrinsic hemorrhagic or proteinaceous signal (series 9, image 33, series 3, image 20). Kidneys are otherwise normal, without obvious renal calculi, solid lesion, or hydronephrosis. Stomach/Bowel: Stomach is within normal limits. No evidence of bowel wall thickening, distention, or inflammatory changes.  Pancolonic diverticulosis. Vascular/Lymphatic: Aortic atherosclerosis. No enlarged abdominal lymph nodes. Other: Fat containing umbilical hernia.  No ascites. Musculoskeletal: No acute or significant osseous findings. IMPRESSION: 1. Unchanged, nonenhancing ablation site in the peripheral superior pole of the left kidney without evidence of residual or recurrent disease. 2. No evidence of recurrent or metastatic disease in the abdomen. 3. Pancolonic diverticulosis. Aortic Atherosclerosis (ICD10-I70.0). Electronically Signed   By: Marolyn JONETTA Jaksch M.D.   On: 02/15/2024 17:36    Labs:  CBC: Recent Labs    05/12/23 1327 12/05/23 1006  WBC 10.0 8.9  HGB 15.1 14.4  HCT 46.6 45.2  PLT 391 419*    COAGS: No results for input(s): INR, APTT in the last 8760 hours.  BMP: Recent Labs    05/12/23 1327 12/05/23 1006  NA 141 139  K 3.9 3.7  CL 107 109  CO2 23 21*  GLUCOSE 83 107*  BUN 10 13  CALCIUM 9.5 9.1  CREATININE 1.60* 1.58*  GFRNONAA 44* 44*    LIVER FUNCTION TESTS: Recent Labs    05/12/23 1327  BILITOT 0.8  AST 22  ALT 18  ALKPHOS 76  PROT 7.0  ALBUMIN 3.8    TUMOR MARKERS: No results for input(s): AFPTM, CEA, CA199, CHROMGRNA in the last 8760 hours.  Assessment and Plan: My impression is that Mr. Aikey is doing very well after cryoablation of his left renal cell carcinoma. The most recent MR shows no residual/recurrent tumor, and the expected involution of the ablation site without complicating features. We reviewed the imaging findings and implications. We reviewed the importance of continued surveillance until stability x5 years is confirmed. He seemed to understand and did ask appropriate questions which were answered. We will plan to get a final  follow-up MRI in about 12 months from now, and assuming that is fine the 5 years of followup effective mean cure.  Thank you for this interesting consult.  I greatly enjoyed meeting Isreal Moline and look forward  to participating in their care.  A copy of this report was sent to the requesting provider on this date.  Electronically Signed: Dayne Philemon Riedesel 02/24/2024, 1:44 PM   I spent a total of    15 Minutes in remote  clinical consultation, greater than 50% of which was counseling/coordinating care for renal mass post ablation.    Visit type: Audio only (telephone). Audio (no video) only due to patient's lack of internet/smartphone capability. Alternative for in-person  consultation at [facility], [address], [place], [place].   format is felt to be most appropriate for this patient at this time.  All issues noted in this document were discussed and addressed.

## 2024-09-23 ENCOUNTER — Inpatient Hospital Stay: Admitting: Hematology and Oncology

## 2024-09-23 ENCOUNTER — Inpatient Hospital Stay
